# Patient Record
Sex: Female | Born: 1970 | Race: Black or African American | Hispanic: No | Smoking: Never smoker
Health system: Southern US, Community
[De-identification: ages and names within clinical notes are randomized; demographics above are authoritative.]

## PROBLEM LIST (undated history)

## (undated) DIAGNOSIS — M1990 Unspecified osteoarthritis, unspecified site: Secondary | ICD-10-CM

## (undated) DIAGNOSIS — F419 Anxiety disorder, unspecified: Secondary | ICD-10-CM

## (undated) DIAGNOSIS — K5909 Other constipation: Secondary | ICD-10-CM

## (undated) DIAGNOSIS — M069 Rheumatoid arthritis, unspecified: Secondary | ICD-10-CM

## (undated) DIAGNOSIS — E669 Obesity, unspecified: Secondary | ICD-10-CM

## (undated) DIAGNOSIS — E119 Type 2 diabetes mellitus without complications: Secondary | ICD-10-CM

## (undated) DIAGNOSIS — I10 Essential (primary) hypertension: Secondary | ICD-10-CM

## (undated) DIAGNOSIS — N946 Dysmenorrhea, unspecified: Secondary | ICD-10-CM

## (undated) DIAGNOSIS — N920 Excessive and frequent menstruation with regular cycle: Secondary | ICD-10-CM

## (undated) HISTORY — DX: Obesity, unspecified: E66.9

## (undated) HISTORY — DX: Essential (primary) hypertension: I10

## (undated) HISTORY — DX: Dysmenorrhea, unspecified: N94.6

## (undated) HISTORY — DX: Anxiety disorder, unspecified: F41.9

## (undated) HISTORY — DX: Type 2 diabetes mellitus without complications: E11.9

## (undated) HISTORY — DX: Rheumatoid arthritis, unspecified: M06.9

## (undated) HISTORY — DX: Excessive and frequent menstruation with regular cycle: N92.0

---

## 1994-08-10 DIAGNOSIS — K5909 Other constipation: Secondary | ICD-10-CM

## 1994-08-10 HISTORY — DX: Other constipation: K59.09

## 1994-08-10 HISTORY — PX: CHOLECYSTECTOMY: SHX55

## 2004-08-14 ENCOUNTER — Ambulatory Visit: Payer: Self-pay | Admitting: Internal Medicine

## 2004-09-27 ENCOUNTER — Ambulatory Visit: Payer: Self-pay | Admitting: Internal Medicine

## 2005-08-17 ENCOUNTER — Ambulatory Visit: Payer: Self-pay | Admitting: Obstetrics and Gynecology

## 2005-08-18 ENCOUNTER — Ambulatory Visit: Payer: Self-pay | Admitting: Obstetrics and Gynecology

## 2007-02-24 ENCOUNTER — Other Ambulatory Visit: Payer: Self-pay

## 2007-02-24 ENCOUNTER — Emergency Department: Payer: Self-pay | Admitting: Emergency Medicine

## 2008-06-25 ENCOUNTER — Ambulatory Visit: Payer: Self-pay | Admitting: Family

## 2008-08-13 ENCOUNTER — Ambulatory Visit: Payer: Self-pay | Admitting: Family

## 2008-08-21 ENCOUNTER — Ambulatory Visit: Payer: Self-pay | Admitting: Specialist

## 2009-07-22 ENCOUNTER — Other Ambulatory Visit: Payer: Self-pay | Admitting: Family

## 2010-07-22 ENCOUNTER — Ambulatory Visit: Payer: Self-pay | Admitting: Internal Medicine

## 2011-01-12 ENCOUNTER — Other Ambulatory Visit: Payer: Self-pay | Admitting: Internal Medicine

## 2011-07-24 ENCOUNTER — Ambulatory Visit (INDEPENDENT_AMBULATORY_CARE_PROVIDER_SITE_OTHER): Payer: PRIVATE HEALTH INSURANCE | Admitting: Internal Medicine

## 2011-07-24 ENCOUNTER — Encounter: Payer: Self-pay | Admitting: Internal Medicine

## 2011-07-24 DIAGNOSIS — E119 Type 2 diabetes mellitus without complications: Secondary | ICD-10-CM

## 2011-07-24 DIAGNOSIS — Z124 Encounter for screening for malignant neoplasm of cervix: Secondary | ICD-10-CM

## 2011-07-24 DIAGNOSIS — E282 Polycystic ovarian syndrome: Secondary | ICD-10-CM

## 2011-07-24 DIAGNOSIS — E669 Obesity, unspecified: Secondary | ICD-10-CM

## 2011-07-24 MED ORDER — NYSTATIN 100000 UNIT/GM EX POWD: CUTANEOUS | Status: AC

## 2011-07-24 MED ORDER — NORETHINDRONE 0.35 MG PO TABS: 1.0000 | ORAL_TABLET | Freq: Every day | ORAL | Status: DC

## 2011-07-24 NOTE — Assessment & Plan Note (Addendum)
She had a normal colposocopy in Jan 2011 which was done because of a prior positive HPV screen.  Will repeat PAP next year per patient preference.

## 2011-07-24 NOTE — Patient Instructions (Signed)
Your symptoms suggest that you have a syndrome called polycystic ovarian syndrome.  Weight loss will help prevent this from becoming a  serious health issues .  Use colace liquid, or Debrox to soften the ear wax in your right ear, and return for an irrigation in a few weeks if no improvement   Have your 2 hour post lunch blood sugar checked   Resume the low glyceminc index diet

## 2011-07-24 NOTE — Assessment & Plan Note (Signed)
sggested by recetn hgba1c of 6.3 by Sept las.  She has had elevated a1cs befoe which resonded to dieting and low glycemic enidex diet whic she has not been following.  Discussion today of lwo glyceminc indes,  Will repeat a1c in April

## 2011-07-26 ENCOUNTER — Encounter: Payer: Self-pay | Admitting: Internal Medicine

## 2011-07-26 DIAGNOSIS — E669 Obesity, unspecified: Secondary | ICD-10-CM | POA: Insufficient documentation

## 2011-07-26 DIAGNOSIS — E1159 Type 2 diabetes mellitus with other circulatory complications: Secondary | ICD-10-CM | POA: Insufficient documentation

## 2011-07-26 NOTE — Assessment & Plan Note (Addendum)
Diet controlled, with recent  hgba1c of 6.3.  The etiology may be post prandial elevatiosn she she has never had a diagnostic fasting glucose, so I have recommended that she check her 2 hour post prandials.  Spent 15 minutes of visit discussing her current diet, obesity and hirutism,  All suggestive of the diagnosis of PCOS. She does not have hypertriglyceridemia, but her LDL is 105.  recommended loss of 10% of her body weight using a structured diet systen, such as Medifast, Atkins , or Paleo diet  .  She will need repeat labs in 3 months.

## 2011-07-26 NOTE — Progress Notes (Signed)
  Subjective:    Patient ID: Mariah Brandt, female    DOB: 07/09/1971, 40 y.o.   MRN: [id number]  HPI  [name] is a 40 yr old AA female  with a history of diet controlled diabetes and obesity who is here to reestablish care.  She was last seen over one year ago for her annual exam. She had fasting labs done in September at  [facility] which she brings with her today.  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 She does not drink alcohol to excess, use tobacco or illicits,  And does not exercise regularly.  She has been able in the past to lose weight when prompted by abnormal labs which indicated diabetes, which has in the past lowered her hgba1c into the normal range.     Review of Systems  Constitutional: Positive for unexpected weight change. Negative for fever and chills.  HENT: Negative for hearing loss, ear pain, nosebleeds, congestion, sore throat, facial swelling, rhinorrhea, sneezing, mouth sores, trouble swallowing, neck pain, neck stiffness, voice change, postnasal drip, sinus pressure, tinnitus and ear discharge.   Eyes: Negative for pain, discharge, redness and visual disturbance.  Respiratory: Negative for cough, chest tightness, shortness of breath, wheezing and stridor.   Cardiovascular: Negative for chest pain, palpitations and leg swelling.  Genitourinary: Positive for menstrual problem.  Musculoskeletal: Negative for myalgias and arthralgias.  Skin: Negative for color change and rash.  Neurological: Negative for dizziness, weakness, light-headedness and headaches.  Hematological: Negative for adenopathy.       Objective:   Physical Exam  Constitutional: She is oriented to person, place, and time. She appears well-developed and well-nourished.       Hirsute, obese  HENT:  Mouth/Throat: Oropharynx is clear and moist.  Eyes: EOM are normal. Pupils are equal, round, and reactive to light. No scleral icterus.    Neck: Normal range of motion. Neck supple. No JVD present. No thyromegaly present.  Cardiovascular: Normal rate, regular rhythm, normal heart sounds and intact distal pulses.   Pulmonary/Chest: Effort normal and breath sounds normal.  Abdominal: Soft. Bowel sounds are normal. She exhibits no mass. There is no tenderness.  Musculoskeletal: Normal range of motion. She exhibits no edema.  Lymphadenopathy:    She has no cervical adenopathy.  Neurological: She is alert and oriented to person, place, and time.  Skin: Skin is warm and dry.  Psychiatric: She has a normal mood and affect.          Assessment & Plan:

## 2011-07-26 NOTE — Assessment & Plan Note (Signed)
Secondary to lack of exercise .  Discussed diet, . Dx of diabetes, PCOS and recommened 10% of body weight loss over the next several months.

## 2011-11-04 ENCOUNTER — Telehealth: Payer: Self-pay | Admitting: Internal Medicine

## 2011-11-04 NOTE — Telephone Encounter (Signed)
[phone] Pt would like order for her labs sent [facility] Please advise when order has been sent

## 2011-11-05 NOTE — Telephone Encounter (Signed)
Order has been sent

## 2011-11-05 NOTE — Telephone Encounter (Signed)
OK I have the [facility] order form to send

## 2011-11-05 NOTE — Telephone Encounter (Signed)
Patient has an appt at the end of April, she wanted to know if she needed any labs prior to her visit.  She will go to [facility] lab.  Please advise.

## 2011-11-09 ENCOUNTER — Ambulatory Visit: Payer: PRIVATE HEALTH INSURANCE | Admitting: Internal Medicine

## 2011-12-01 ENCOUNTER — Other Ambulatory Visit: Payer: Self-pay | Admitting: Internal Medicine

## 2011-12-01 LAB — COMPREHENSIVE METABOLIC PANEL
Albumin: 3.9 g/dL (ref 3.4–5.0)
BUN: 6 mg/dL — ABNORMAL LOW (ref 7–18)
Bilirubin,Total: 0.7 mg/dL (ref 0.2–1.0)
Calcium, Total: 8.4 mg/dL — ABNORMAL LOW (ref 8.5–10.1)
Chloride: 106 mmol/L (ref 98–107)
Creatinine: 0.68 mg/dL (ref 0.60–1.30)
EGFR (Non-African Amer.): >60
Glucose: 98 mg/dL (ref 65–99)
Potassium: 3.4 mmol/L — ABNORMAL LOW (ref 3.5–5.1)
SGPT (ALT): 20 U/L
Sodium: 141 mmol/L (ref 136–145)

## 2011-12-01 LAB — LIPID PANEL
Cholesterol: 170 mg/dL (ref 0–200)
HDL Cholesterol: 50 mg/dL (ref 40–60)
Ldl Cholesterol, Calc: 103 mg/dL — ABNORMAL HIGH (ref 0–100)
VLDL Cholesterol, Calc: 17 mg/dL (ref 5–40)

## 2011-12-07 ENCOUNTER — Encounter: Payer: Self-pay | Admitting: Internal Medicine

## 2011-12-07 ENCOUNTER — Ambulatory Visit (INDEPENDENT_AMBULATORY_CARE_PROVIDER_SITE_OTHER): Payer: PRIVATE HEALTH INSURANCE | Admitting: Internal Medicine

## 2011-12-07 VITALS — BP 110/72 | HR 90 | Temp 98.1°F | Resp 16 | Wt 174.8 lb

## 2011-12-07 DIAGNOSIS — F411 Generalized anxiety disorder: Secondary | ICD-10-CM

## 2011-12-07 DIAGNOSIS — E119 Type 2 diabetes mellitus without complications: Secondary | ICD-10-CM

## 2011-12-07 DIAGNOSIS — E669 Obesity, unspecified: Secondary | ICD-10-CM

## 2011-12-07 DIAGNOSIS — E876 Hypokalemia: Secondary | ICD-10-CM

## 2011-12-07 NOTE — Assessment & Plan Note (Signed)
Discussed resuming zoloft, for mood disorder.  She will consider this but is reluctant due to weight gain.

## 2011-12-07 NOTE — Assessment & Plan Note (Signed)
No change in hgba1c of 6.3,  No priro diagnostic fasting glucose.  Has not checked her sugars as requested.  Not following a low glycemic index diet yet and BMI remains > 30.  I have addressed  BMI and recommended a low glycemic index diet utilizing smaller more frequent meals to increase metabolism.  I have also recommended that patient start exercising with a goal of 30 minutes of aerobic exercise a minimum of 5 days per week. Screening for lipid disorders,was done 3 months ago and LDL was 105 normal trigs.

## 2011-12-07 NOTE — Progress Notes (Signed)
Patient ID: Mariah Brandt, female   DOB: 01-22-1971, 41 y.o.   MRN: [id number]  Patient Active Problem List  Diagnoses  . Screening for cervical cancer  . Obesity (BMI 30-39.9)  . Type II or unspecified type diabetes mellitus without mention of complication, not stated as uncontrolled  . Hypokalemia    Subjective:  CC:   Chief Complaint  Patient presents with  . Follow-up    HPI:   Mariah Frisby-Hendrixis a 41 y.o. female who presents  Follow up on labs done last week .  She describes recent onset of mood swings from angry to sad, no manic behavior , occurring both work and at home.  She has been having episodes of dizziness and intermittent, blurred vision. No diaphoresis, weakness, headaches or tremors.   Since the diagnosis of pre-iabetes by prior HgbA1c she has increased her water intake and has cut out sweet tea and switched to diet sodas, but does not know what to do with her diet. No eye exam in 2 years.   Past Medical History  Diagnosis Date  . Obesity (BMI 30-39.9)   . Diabetes mellitus 2010    diet controlled    Past Surgical History  Procedure Date  . Cholecystectomy 1996         The following portions of the patient's history were reviewed and updated as appropriate: Allergies, current medications, and problem list.    Review of Systems:   12 Pt  review of systems was negative except those addressed in the HPI,     History   Social History  . Marital Status: Married    Spouse Name: N/A    Number of Children: N/A  . Years of Education: N/A   Occupational History  . Not on file.   Social History Main Topics  . Smoking status: Never Smoker   . Smokeless tobacco: Never Used  . Alcohol Use: Yes     Pt drinks socially, maybe once a month  . Drug Use: No  . Sexually Active: Not on file   Other Topics Concern  . Not on file   Social History Narrative  . No narrative on file    Objective:  BP 110/72  Pulse 90  Temp(Src)  98.1 F (36.7 C) (Oral)  Resp 16  Wt 174 lb 12 oz (79.266 kg)  SpO2 98%  LMP 11/22/2011  General appearance: alert, cooperative and appears stated age Ears: normal TM's and external ear canals both ears Throat: lips, mucosa, and tongue normal; teeth and gums normal Neck: no adenopathy, no carotid bruit, supple, symmetrical, trachea midline and thyroid not enlarged, symmetric, no tenderness/mass/nodules Back: symmetric, no curvature. ROM normal. No CVA tenderness. Lungs: clear to auscultation bilaterally Heart: regular rate and rhythm, S1, S2 normal, no murmur, click, rub or gallop Abdomen: soft, non-tender; bowel sounds normal; no masses,  no organomegaly Pulses: 2+ and symmetric Skin: Skin color, texture, turgor normal. No rashes or lesions Lymph nodes: Cervical, supraclavicular, and axillary nodes normal.  Assessment and Plan: Type II or unspecified type diabetes mellitus without mention of complication, not stated as uncontrolled No change in hgba1c of 6.3,  No priro diagnostic fasting glucose.  Has not checked her sugars as requested.  Not following a low glycemic index diet yet and BMI remains > 30.  I have addressed  BMI and recommended a low glycemic index diet utilizing smaller more frequent meals to increase metabolism.  I have also recommended that patient start exercising with a goal of  30 minutes of aerobic exercise a minimum of 5 days per week. Screening for lipid disorders,was done 3 months ago and LDL was 105 normal trigs.     Obesity (BMI 30-39.9) I have addressed  BMI and recommended a low glycemic index diet utilizing smaller more frequent meals to increase metabolism.  I have also recommended that patient start exercising with a goal of 30 minutes of aerobic exercise a minimum of 5 days per week.    GAD (generalized anxiety disorder) Discussed resuming zoloft, for mood disorder.  She will consider this but is reluctant due to weight gain.     Updated Medication  List Outpatient Encounter Prescriptions as of 12/07/2011  Medication Sig Dispense Refill  . norethindrone (MICRONOR,CAMILA,ERRIN) 0.35 MG tablet Take 1 tablet (0.35 mg total) by mouth daily.  1 Package  11  . nystatin (MYCOSTATIN) powder Apply to affected area 2 times daily  15 g  1

## 2011-12-07 NOTE — Assessment & Plan Note (Signed)
I have addressed  BMI and recommended a low glycemic index diet utilizing smaller more frequent meals to increase metabolism.  I have also recommended that patient start exercising with a goal of 30 minutes of aerobic exercise a minimum of 5 days per week.  

## 2011-12-07 NOTE — Patient Instructions (Signed)
Consider the Low Glycemic Index Diet and 6 smaller meals daily .  This boosts your metabolism and regulates your sugars:   7 AM Low carbohydrate Protein  Shakes (EAS Carb Control  Or Atkins ,  Available everywhere,   In  cases at BJs )  2.5 carbs  (Add or substitute a toasted sandwhich thin w/ peanut butter)  10 AM: Protein bar by Atkins (snack size,  Chocolate lover's variety at  BJ's)    Lunch: sandwich on pita bread or flatbread (Joseph's makes a pita bread and a flat bread , available at [employer] and BJ's; Toufayah makes a low carb flatbread available at [employer] and [employer]) Mission makes a low carb whole wheat tortilla available at BJs,and most grocery stores   3 PM:  Mid day :  Another protein bar,  Or a  cheese stick, 1/4 cup of almonds, walnuts, pistachios, pecans, peanuts,  Macadamia nuts  6 PM  Dinner:  "mean and green:"  Meat/chicken/fish, salad, and green veggie : use ranch, vinagrette,  Blue cheese, etc  9 PM snack : Breyer's low carb fudgsicle or  ice cream bar (Carb Smart), or  Weight Watcher's ice cream bar , or another protein shake  

## 2012-02-08 ENCOUNTER — Encounter: Payer: Self-pay | Admitting: Internal Medicine

## 2012-07-22 ENCOUNTER — Other Ambulatory Visit: Payer: Self-pay

## 2012-07-22 MED ORDER — NORETHINDRONE 0.35 MG PO TABS: 1.0000 | ORAL_TABLET | Freq: Every day | ORAL | Status: DC

## 2012-07-22 NOTE — Telephone Encounter (Signed)
Refill request for Mariah Brandt 0.35 mg sent to [facility]

## 2012-09-21 LAB — HM DIABETES EYE EXAM: HM Diabetic Eye Exam: NORMAL

## 2012-12-21 LAB — HM DIABETES FOOT EXAM: HM DIABETIC FOOT EXAM: NORMAL

## 2013-06-21 ENCOUNTER — Encounter: Payer: Self-pay | Admitting: Internal Medicine

## 2013-06-21 ENCOUNTER — Ambulatory Visit (INDEPENDENT_AMBULATORY_CARE_PROVIDER_SITE_OTHER): Payer: 59 | Admitting: Internal Medicine

## 2013-06-21 VITALS — BP 124/82 | HR 76 | Temp 98.8°F | Resp 12 | Ht 61.0 in | Wt 171.8 lb

## 2013-06-21 DIAGNOSIS — F411 Generalized anxiety disorder: Secondary | ICD-10-CM

## 2013-06-21 DIAGNOSIS — E119 Type 2 diabetes mellitus without complications: Secondary | ICD-10-CM

## 2013-06-21 DIAGNOSIS — N8000 Endometriosis of the uterus, unspecified: Secondary | ICD-10-CM

## 2013-06-21 DIAGNOSIS — Z0000 Encounter for general adult medical examination without abnormal findings: Secondary | ICD-10-CM

## 2013-06-21 DIAGNOSIS — Z124 Encounter for screening for malignant neoplasm of cervix: Secondary | ICD-10-CM

## 2013-06-21 DIAGNOSIS — N8003 Adenomyosis of the uterus: Secondary | ICD-10-CM

## 2013-06-21 DIAGNOSIS — E669 Obesity, unspecified: Secondary | ICD-10-CM

## 2013-06-21 MED ORDER — CITALOPRAM HYDROBROMIDE 10 MG PO TABS: 10.0000 mg | ORAL_TABLET | Freq: Every day | ORAL | Status: DC

## 2013-06-21 MED ORDER — NORETHINDRONE 0.35 MG PO TABS: 1.0000 | ORAL_TABLET | Freq: Every day | ORAL | Status: DC

## 2013-06-21 NOTE — Progress Notes (Signed)
Pre-visit discussion using our clinic review tool. No additional management support is needed unless otherwise documented below in the visit note.  

## 2013-06-21 NOTE — Patient Instructions (Signed)
You had a PAP smear and  Physical today  Please get your fasting labs done ASAP at [facility].  It will take me a few days to input the data so you can access them via MyChart  If your A1c is still in the low 6.0s,  I will see you again in 6 months (sooner if higher)

## 2013-06-22 ENCOUNTER — Other Ambulatory Visit (HOSPITAL_COMMUNITY)
Admission: RE | Admit: 2013-06-22 | Discharge: 2013-06-22 | Disposition: A | Discharge status: Home / Self Care | Payer: 59 | Source: Ambulatory Visit | Attending: Internal Medicine | Admitting: Internal Medicine

## 2013-06-22 DIAGNOSIS — Z1151 Encounter for screening for human papillomavirus (HPV): Secondary | ICD-10-CM | POA: Insufficient documentation

## 2013-06-22 DIAGNOSIS — Z01419 Encounter for gynecological examination (general) (routine) without abnormal findings: Secondary | ICD-10-CM | POA: Insufficient documentation

## 2013-06-23 DIAGNOSIS — Z0000 Encounter for general adult medical examination without abnormal findings: Secondary | ICD-10-CM | POA: Insufficient documentation

## 2013-06-23 NOTE — Assessment & Plan Note (Addendum)
Resuming citalopram

## 2013-06-23 NOTE — Progress Notes (Signed)
Patient ID: Mariah Brandt, female   DOB: Oct 11, 1970, 42 y.o.   MRN: [id number]  Subjective:     Mariah Brandt is a 42 y.o. female and is here for a comprehensive physical exam. She has not been seen in over a year,  Has diet controlled DM.  The patient reports increased irritability at home due to stressors at work and is requesting to resume her SSRI.     History   Social History  . Marital Status: Married    Spouse Name: N/A    Number of Children: N/A  . Years of Education: N/A   Occupational History  . Not on file.   Social History Main Topics  . Smoking status: Never Smoker   . Smokeless tobacco: Never Used  . Alcohol Use: Yes     Comment: Pt drinks socially, maybe once a month  . Drug Use: No  . Sexual Activity: Not on file   Other Topics Concern  . Not on file   Social History Narrative  . No narrative on file   Health Maintenance  Topic Date Due  . Influenza Vaccine  03/10/2014  . Pap Smear  06/21/2016  . Tetanus/tdap  06/21/2021    The following portions of the patient's history were reviewed and updated as appropriate: allergies, current medications, past family history, past medical history, past social history, past surgical history and problem list.  Review of Systems A comprehensive review of systems was negative.   Objective:   General Appearance:    Alert, cooperative, no distress, appears stated age  Head:    Normocephalic, without obvious abnormality, atraumatic  Eyes:    PERRL, conjunctiva/corneas clear, EOM's intact, fundi    benign, both eyes  Ears:    Normal TM's and external ear canals, both ears  Nose:   Nares normal, septum midline, mucosa normal, no drainage    or sinus tenderness  Throat:   Lips, mucosa, and tongue normal; teeth and gums normal  Neck:   Supple, symmetrical, trachea midline, no adenopathy;    thyroid:  no enlargement/tenderness/nodules; no carotid   bruit or JVD  Back:     Symmetric, no curvature, ROM  normal, no CVA tenderness  Lungs:     Clear to auscultation bilaterally, respirations unlabored  Chest Wall:    No tenderness or deformity   Heart:    Regular rate and rhythm, S1 and S2 normal, no murmur, rub   or gallop  Breast Exam:    No tenderness, masses, or nipple abnormality  Abdomen:     Soft, non-tender, bowel sounds active all four quadrants,    no masses, no organomegaly  Genitalia:    Pelvic: cervix normal in appearance, external genitalia normal, no adnexal masses or tenderness, no cervical motion tenderness, rectovaginal septum normal, uterus normal size, shape, and consistency and vagina normal without discharge  Extremities:   Extremities normal, atraumatic, no cyanosis or edema  Pulses:   2+ and symmetric all extremities  Skin:   Skin color, texture, turgor normal, no rashes or lesions  Lymph nodes:   Cervical, supraclavicular, and axillary nodes normal  Neurologic:   CNII-XII intact, normal strength, sensation and reflexes    throughout    Assessment:   Type II or unspecified type diabetes mellitus without mention of complication, not stated as uncontrolled She is diet controlled but has not had labs in over one year.  Foot exam is normal.  Eye exam recommended.  Labs ordered.  Low GI diet given.  Obesity (BMI 30-39.9) I have addressed  BMI and recommended wt loss of 10% of body weigh over the next 6 months using a low glycemic index diet and regular exercise a minimum of 5 days per week.    GAD (generalized anxiety disorder) Resuming citalopram  Encounter for health maintenance examination Annual comprehensive exam was done including breast, pelvic and PAP smear. All screenings have been addressed .    Updated Medication List Outpatient Encounter Prescriptions as of 06/21/2013  Medication Sig  . citalopram (CELEXA) 10 MG tablet Take 1 tablet (10 mg total) by mouth daily.  . norethindrone (MICRONOR,CAMILA,ERRIN) 0.35 MG tablet Take 1 tablet (0.35 mg total) by  mouth daily.  . [DISCONTINUED] norethindrone (MICRONOR,CAMILA,ERRIN) 0.35 MG tablet Take 1 tablet (0.35 mg total) by mouth daily.

## 2013-06-23 NOTE — Assessment & Plan Note (Signed)
She is diet controlled but has not had labs in over one year.  Foot exam is normal.  Eye exam recommended.  Labs ordered.  Low GI diet given.

## 2013-06-23 NOTE — Assessment & Plan Note (Signed)
I have addressed  BMI and recommended wt loss of 10% of body weigh over the next 6 months using a low glycemic index diet and regular exercise a minimum of 5 days per week.   

## 2013-06-23 NOTE — Assessment & Plan Note (Signed)
Annual comprehensive exam was done including breast, pelvic and PAP smear. All screenings have been addressed .  

## 2013-06-25 LAB — HM PAP SMEAR: HM PAP: NORMAL

## 2013-06-26 ENCOUNTER — Encounter: Payer: Self-pay | Admitting: *Deleted

## 2013-06-26 ENCOUNTER — Other Ambulatory Visit: Payer: Self-pay | Admitting: Internal Medicine

## 2013-06-26 LAB — COMPREHENSIVE METABOLIC PANEL
Albumin: 3.8 g/dL (ref 3.4–5.0)
Alkaline Phosphatase: 118 U/L (ref 50–136)
Bilirubin,Total: 0.7 mg/dL (ref 0.2–1.0)
Glucose: 95 mg/dL (ref 65–99)
SGOT(AST): 12 U/L — ABNORMAL LOW (ref 15–37)
SGPT (ALT): 17 U/L (ref 12–78)
Sodium: 140 mmol/L (ref 136–145)

## 2013-06-26 LAB — CBC WITH DIFFERENTIAL/PLATELET
Eosinophil %: 1.4 %
HCT: 41.6 % (ref 35.0–47.0)
HGB: 13.9 g/dL (ref 12.0–16.0)
MCH: 30.1 pg (ref 26.0–34.0)
MCHC: 33.5 g/dL (ref 32.0–36.0)
MCV: 90 fL (ref 80–100)
Monocyte #: 0.8 x10 3/mm (ref 0.2–0.9)
Neutrophil #: 3.6 10*3/uL (ref 1.4–6.5)
RBC: 4.61 10*6/uL (ref 3.80–5.20)
RDW: 14.1 % (ref 11.5–14.5)
WBC: 6.4 10*3/uL (ref 3.6–11.0)

## 2013-06-26 LAB — LIPID PANEL
Cholesterol: 156 mg/dL (ref 0–200)
Ldl Cholesterol, Calc: 96 mg/dL (ref 0–100)
Triglycerides: 80 mg/dL (ref 0–200)

## 2013-07-03 ENCOUNTER — Telehealth: Payer: Self-pay | Admitting: Internal Medicine

## 2013-07-03 DIAGNOSIS — E876 Hypokalemia: Secondary | ICD-10-CM

## 2013-07-03 MED ORDER — POTASSIUM CHLORIDE ER 10 MEQ PO TBCR: 20.0000 meq | EXTENDED_RELEASE_TABLET | Freq: Every day | ORAL | Status: DC

## 2013-07-03 NOTE — Telephone Encounter (Signed)
Left message for patient to return call to office. 

## 2013-07-03 NOTE — Telephone Encounter (Signed)
Labs  Reviewed. All labs normal,. Including thyroid function and cholesterol , except potassium is slightly low at 3.3 .  I have rx'd  potassium chloride 10 meQ  2 tablets  daily # for 4 days qty #30 ,  Only take for 4 days,  Keep the rest on hand for future needs

## 2013-07-05 NOTE — Telephone Encounter (Signed)
Left message for patient to call office.  

## 2013-07-18 ENCOUNTER — Encounter: Payer: Self-pay | Admitting: Internal Medicine

## 2013-07-19 NOTE — Telephone Encounter (Signed)
Patient notifed

## 2013-07-24 ENCOUNTER — Encounter: Payer: Self-pay | Admitting: Internal Medicine

## 2013-07-25 LAB — HEPATIC FUNCTION PANEL
ALT: 17 U/L (ref 7–35)
AST: 12 U/L — AB (ref 13–35)
Alkaline Phosphatase: 118 U/L (ref 25–125)
Bilirubin, Total: 0.7 mg/dL

## 2013-07-25 LAB — LIPID PANEL
CHOLESTEROL: 156 mg/dL (ref 0–200)
HDL: 44 mg/dL (ref 35–70)
LDL Cholesterol: 96 mg/dL
Triglycerides: 80 mg/dL (ref 40–160)

## 2013-07-25 LAB — CBC AND DIFFERENTIAL
Hemoglobin: 13.9 g/dL (ref 12.0–16.0)
Platelets: 252 10*3/uL (ref 150–399)
WBC: 6.4 10*3/mL

## 2013-07-25 LAB — BASIC METABOLIC PANEL
BUN: 26 mg/dL — AB (ref 4–21)
CREATININE: 0.7 mg/dL (ref 0.5–1.1)
Glucose: 95 mg/dL
POTASSIUM: 3.3 mmol/L — AB (ref 3.4–5.3)
Sodium: 140 mmol/L (ref 137–147)

## 2013-12-20 ENCOUNTER — Encounter: Payer: Self-pay | Admitting: Internal Medicine

## 2013-12-20 ENCOUNTER — Ambulatory Visit (INDEPENDENT_AMBULATORY_CARE_PROVIDER_SITE_OTHER): Payer: 59 | Admitting: Internal Medicine

## 2013-12-20 VITALS — BP 126/98 | HR 84 | Temp 99.1°F | Resp 16 | Wt 182.5 lb

## 2013-12-20 DIAGNOSIS — E119 Type 2 diabetes mellitus without complications: Secondary | ICD-10-CM

## 2013-12-20 DIAGNOSIS — F411 Generalized anxiety disorder: Secondary | ICD-10-CM

## 2013-12-20 DIAGNOSIS — E669 Obesity, unspecified: Secondary | ICD-10-CM

## 2013-12-20 LAB — HM DIABETES FOOT EXAM: HM DIABETIC FOOT EXAM: NORMAL

## 2013-12-20 NOTE — Patient Instructions (Signed)
I want you to get to 158 lbs  By the end of this year.  This is  One version of a  "Low GI"  Diet:  It will still lower your blood sugars and allow you to lose 4 to 8  lbs  per month if you follow it carefully.  Your goal with exercise is a minimum of 30 minutes of aerobic exercise 5 days per week (Walking does not count once it becomes easy!)    All of the foods can be found at grocery stores and in bulk at [employer].  The Atkins protein bars and shakes are available in more varieties at Target, WalMart and [place].     7 AM Breakfast:  Choose from the following:  Low carbohydrate Protein  Shakes (I recommend the EAS AdvantEdge "Carb Control" shakes  Or the low carb shakes by Atkins.    2.5 carbs   Arnold's "Sandwhich Thin"toasted  w/ peanut butter (no jelly: about 20 net carbs  "Bagel Thin" with cream cheese and salmon: about 20 carbs   a scrambled egg/bacon/cheese burrito made with Mission's "carb balance" whole wheat tortilla  (about 10 net carbs )   Avoid cereal and bananas, oatmeal and cream of wheat and grits. They are loaded with carbohydrates!   10 AM: high protein snack  Protein bar by Atkins (the snack size, under 200 cal, usually < 6 net carbs).    A stick of cheese:  Around 1 carb,  100 cal     Dannon Light n Fit [place] Yogurt  (80 cal, 8 carbs)  Other so called "protein bars" and Greek yogurts tend to be loaded with carbohydrates.  Remember, in food advertising, the word "energy" is synonymous for " carbohydrate."  Lunch:   A Sandwich using the bread choices listed, Can use any  Eggs,  lunchmeat, grilled meat or canned tuna), avocado, regular mayo/mustard  and cheese.  A Salad using blue cheese, ranch,  Goddess or vinagrette,  No croutons or "confetti" and no "candied nuts" but regular nuts OK.   No pretzels or chips.  Pickles and miniature sweet peppers are a good low carb alternative that provide a "crunch"  The bread is the only source of carbohydrate in a sandwich and   can be decreased by trying some of these alternatives to traditional loaf bread  Joseph's makes a pita bread and a flat bread that are 50 cal and 4 net carbs available at [place] and [place].  This can be toasted to use with hummous as well  Toufayan makes a low carb flatbread that's 100 cal and 9 net carbs available at [employer] and BJ's makes 2 sizes of  Low carb whole wheat tortilla  (The large one is 210 cal and 6 net carbs) Avoid "Low fat dressings, as well as Barry Brunner and [place] dressings They are loaded with sugar!   3 PM/ Mid day  Snack:  Consider  1 ounce of  almonds, walnuts, pistachios, pecans, peanuts,  Macadamia nuts or a nut medley.  Avoid "granola"; the dried cranberries and raisins are loaded with carbohydrates. Mixed nuts as long as there are no raisins,  cranberries or dried fruit.    Try the prosciutto/mozzarella cheese sticks by [employer]  In deli /backery section   High protein      6 PM  Dinner:     Meat/fowl/fish with a green salad, and either broccoli, cauliflower, green beans, spinach, brussel sprouts or  [place]  beans. DO NOT BREAD THE PROTEIN!!      There is a low carb pasta by Dreamfield's that is acceptable and tastes great: only 5 digestible carbs/serving.( All grocery stores but BJs carry it )  Try Hurley Cisco Angelo's chicken piccata or chicken or eggplant parm over low carb pasta.(Lowes and BJs)   Marjory Lies Sanchez's "Carnitas" (pulled pork, no sauce,  0 carbs) or his beef pot roast to make a dinner burrito (at BJ's)  Pesto over low carb pasta (bj's sells a good quality pesto in the center refrigerated section of the deli   Try satueeing  Cheral Marker with mushroooms  Whole wheat pasta is still full of digestible carbs and  Not as low in glycemic index as Dreamfield's.   Brown rice is still rice,  So skip the rice and noodles if you eat [place] or [place] (or at least limit to 1/2 cup)  9 PM snack :   Breyer's "low carb" fudgsicle or  ice cream bar (Carb Smart  line), or  Weight Watcher's ice cream bar , or another "no sugar added" ice cream;  a serving of fresh berries/cherries with whipped cream   Cheese or DANNON'S LlGHT N FIT GREEK YOGURT  8 ounces of [place] unsweetened almond/cococunut milk    Avoid bananas, pineapple, grapes  and watermelon on a regular basis because they are high in sugar.  THINK OF THEM AS DESSERT  Remember that snack Substitutions should be less than 10 NET carbs per serving and meals < 20 carbs. Remember to subtract fiber grams to get the "net carbs."

## 2013-12-20 NOTE — Progress Notes (Signed)
Pre-visit discussion using our clinic review tool. No additional management support is needed unless otherwise documented below in the visit note.  

## 2013-12-20 NOTE — Progress Notes (Signed)
Patient ID: Mariah Brandt, female   DOB: February 23, 1971, 43 y.o.   MRN: [id number]  Patient Active Problem List   Diagnosis Date Noted  . Encounter for health maintenance examination 06/23/2013  . Adenomyosis 06/21/2013  . GAD (generalized anxiety disorder) 12/07/2011  . Obesity (BMI 30-39.9)   . Type II or unspecified type diabetes mellitus without mention of complication, not stated as uncontrolled   . Screening for cervical cancer 07/24/2011    Subjective:  CC:   Chief Complaint  Patient presents with  . Follow-up  . Diabetes    HPI:   Mariah Brandt is a 43 y.o. female who presents for Follow up on type 2 DM, GAD and obesity. She has been following a low glycemic index intermittently.   Checks her blood sugars once or twice per week.   Breakfast: bacon egg chees on wheat bread  or sourdough,  Or oatmeal  Lunch:  Pizza or salad   Cheeseburger/fries. Dinner is a grilled or baked meat.  Potato or rice, and a green vegetable.    Has gained 11 lbs since November .  Has not noticed the weight gain. Not exercising regularly,  When she does, it's a walk at lunchtime.    Past Medical History  Diagnosis Date  . Obesity (BMI 30-39.9)   . Diabetes mellitus 2010    diet controlled    Past Surgical History  Procedure Laterality Date  . Cholecystectomy  1996       The following portions of the patient's history were reviewed and updated as appropriate: Allergies, current medications, and problem list.    Review of Systems:   Patient denies headache, fevers, malaise, unintentional weight loss, skin rash, eye pain, sinus congestion and sinus pain, sore throat, dysphagia,  hemoptysis , cough, dyspnea, wheezing, chest pain, palpitations, orthopnea, edema, abdominal pain, nausea, melena, diarrhea, constipation, flank pain, dysuria, hematuria, urinary  Frequency, nocturia, numbness, tingling, seizures,  Focal weakness, Loss of consciousness,  Tremor, insomnia,  depression, anxiety, and suicidal ideation.     History   Social History  . Marital Status: Married    Spouse Name: N/A    Number of Children: N/A  . Years of Education: N/A   Occupational History  . Not on file.   Social History Main Topics  . Smoking status: Never Smoker   . Smokeless tobacco: Never Used  . Alcohol Use: Yes     Comment: Pt drinks socially, maybe once a month  . Drug Use: No  . Sexual Activity: Not on file   Other Topics Concern  . Not on file   Social History Narrative  . No narrative on file    Objective:  Filed Vitals:   12/20/13 1554  BP: 126/98  Pulse: 84  Temp: 99.1 F (37.3 C)  Resp: 16     General appearance: alert, cooperative and appears stated age Ears: normal TM's and external ear canals both ears Throat: lips, mucosa, and tongue normal; teeth and gums normal Neck: no adenopathy, no carotid bruit, supple, symmetrical, trachea midline and thyroid not enlarged, symmetric, no tenderness/mass/nodules Back: symmetric, no curvature. ROM normal. No CVA tenderness. Lungs: clear to auscultation bilaterally Heart: regular rate and rhythm, S1, S2 normal, no murmur, click, rub or gallop Abdomen: soft, non-tender; bowel sounds normal; no masses,  no organomegaly Pulses: 2+ and symmetric Skin: Skin color, texture, turgor normal. No rashes or lesions Lymph nodes: Cervical, supraclavicular, and axillary nodes normal.  Assessment and Plan:  Type II or unspecified type  diabetes mellitus without mention of complication, not stated as uncontrolled Well-controlled on diet alone .  hemoglobin A1c has been consistently less than 7.0 . Patient is up-to-date on eye exams and foot exam was done today.  There is  no proteinuria on today's micro urinalysis .  Fasting lipids have been reviewed and statin therapy has been advised and updated according to new ACC guidelines based on patient's 10 year risk of CAD annual eye exam recommended.    Low GI diet  recommended for weight loss.     Lab Results  Component Value Date   HGBA1C 5.9 12/20/2013   Lab Results  Component Value Date   MICROALBUR 1.8 12/20/2013     GAD (generalized anxiety disorder) Controlled with  citalopram    Obesity (BMI 30-39.9) I have addressed  BMI and recommended wt loss of 10% of body weigh over the next 6 months using a low glycemic index diet and regular exercise a minimum of 5 days per week.     Updated Medication List Outpatient Encounter Prescriptions as of 12/20/2013  Medication Sig  . citalopram (CELEXA) 10 MG tablet Take 1 tablet (10 mg total) by mouth daily.  . norethindrone (MICRONOR,CAMILA,ERRIN) 0.35 MG tablet Take 1 tablet (0.35 mg total) by mouth daily.  . [DISCONTINUED] potassium chloride (K-DUR) 10 MEQ tablet Take 2 tablets (20 mEq total) by mouth daily.     Orders Placed This Encounter  Procedures  . Hemoglobin A1c  . Microalbumin / creatinine urine ratio  . CBC and differential  . Basic metabolic panel  . Hepatic function panel  . Lipid panel  . HM PAP SMEAR  . HM DIABETES FOOT EXAM  . HM DIABETES FOOT EXAM    No Follow-up on file.

## 2013-12-21 LAB — HEMOGLOBIN A1C: Hgb A1c MFr Bld: 5.9 % (ref 4.6–6.5)

## 2013-12-21 LAB — MICROALBUMIN / CREATININE URINE RATIO
Creatinine,U: 250.9 mg/dL
MICROALB UR: 1.8 mg/dL (ref 0.0–1.9)
Microalb Creat Ratio: 0.7 mg/g (ref 0.0–30.0)

## 2013-12-22 ENCOUNTER — Encounter: Payer: Self-pay | Admitting: Internal Medicine

## 2013-12-23 ENCOUNTER — Encounter: Payer: Self-pay | Admitting: Internal Medicine

## 2013-12-23 NOTE — Assessment & Plan Note (Signed)
I have addressed  BMI and recommended wt loss of 10% of body weigh over the next 6 months using a low glycemic index diet and regular exercise a minimum of 5 days per week.   

## 2013-12-23 NOTE — Assessment & Plan Note (Signed)
Controlled with  citalopram

## 2013-12-23 NOTE — Assessment & Plan Note (Addendum)
Well-controlled on diet alone .  hemoglobin A1c has been consistently less than 7.0 . Patient is up-to-date on eye exams and foot exam was done today.  There is  no proteinuria on today's micro urinalysis .  Fasting lipids have been reviewed and statin therapy has been advised and updated according to new ACC guidelines based on patient's 10 year risk of CAD annual eye exam recommended.    Low GI diet recommended for weight loss.     Lab Results  Component Value Date   HGBA1C 5.9 12/20/2013   Lab Results  Component Value Date   MICROALBUR 1.8 12/20/2013

## 2014-03-27 ENCOUNTER — Encounter: Payer: Self-pay | Admitting: *Deleted

## 2014-03-29 NOTE — Telephone Encounter (Signed)
Mailed unread message to pt  

## 2014-04-10 ENCOUNTER — Ambulatory Visit (INDEPENDENT_AMBULATORY_CARE_PROVIDER_SITE_OTHER): Payer: 59 | Admitting: *Deleted

## 2014-04-10 VITALS — BP 104/68 | HR 87 | Temp 99.1°F | Resp 16

## 2014-04-10 DIAGNOSIS — IMO0001 Reserved for inherently not codable concepts without codable children: Secondary | ICD-10-CM

## 2014-04-10 DIAGNOSIS — R030 Elevated blood-pressure reading, without diagnosis of hypertension: Secondary | ICD-10-CM

## 2014-04-10 LAB — MEASURE BLOOD PRESSURE

## 2014-04-12 ENCOUNTER — Encounter: Payer: Self-pay | Admitting: Internal Medicine

## 2014-06-27 ENCOUNTER — Encounter: Payer: Self-pay | Admitting: Internal Medicine

## 2014-06-27 ENCOUNTER — Ambulatory Visit (INDEPENDENT_AMBULATORY_CARE_PROVIDER_SITE_OTHER): Payer: 59 | Admitting: Internal Medicine

## 2014-06-27 VITALS — BP 144/92 | HR 95 | Temp 98.5°F | Resp 15 | Ht 61.0 in | Wt 182.5 lb

## 2014-06-27 DIAGNOSIS — E119 Type 2 diabetes mellitus without complications: Secondary | ICD-10-CM

## 2014-06-27 DIAGNOSIS — E669 Obesity, unspecified: Secondary | ICD-10-CM

## 2014-06-27 DIAGNOSIS — K5909 Other constipation: Secondary | ICD-10-CM

## 2014-06-27 DIAGNOSIS — Z0000 Encounter for general adult medical examination without abnormal findings: Secondary | ICD-10-CM

## 2014-06-27 MED ORDER — LACTULOSE 20 GM/30ML PO SOLN: 30.0000 mL | Freq: Four times a day (QID) | ORAL | Status: DC | PRN

## 2014-06-27 MED ORDER — POLYETHYLENE GLYCOL 3350 17 GM/SCOOP PO POWD: 17.0000 g | Freq: Every day | ORAL | Status: DC

## 2014-06-27 NOTE — Patient Instructions (Signed)
You had your annual  wellness exam today.  We will repeat your PAP smear in 2016, sooner if needed   We will schedule your mammogram soon   Please get fasting labs at your earliest convenience.   We will contact you with the bloodwork results  You need for more fiber in your diet .  Try to get 25 grams daily using nuts,  Low carb flatbreads,  And fruit with edible skins.  And drink 3 16 ounce servigs of water daily  You can use the lactulose once a week,  But use the miralax daily at bedtime  I want you to lose 18 lbs over the next six months with a low glycemic index diet and regular exercise (30 minutes of cardio 5 days per week is your goal)  This is  my version of a  "Low GI"  Diet:  It will still lower your blood sugars and allow you to lose 4 to 8  lbs  per month if you follow it carefully.  Your goal with exercise is a minimum of 30 minutes of aerobic exercise 5 days per week (Walking does not count once it becomes easy!)     All of the foods can be found at grocery stores and in bulk at [employer].  The Atkins protein bars and shakes are available in more varieties at [employer], [employer] and [place].     7 AM Breakfast:  Choose from the following:  Low carbohydrate Protein  Shakes (I recommend the EAS AdvantEdge "Carb Control" shakes  Or the low carb shakes by Atkins.    2.5 carbs   Arnold's "Sandwhich Thin"toasted  w/ peanut butter (no jelly: about 20 net carbs  "Bagel Thin" with cream cheese and salmon: about 20 carbs   a scrambled egg/bacon/cheese burrito made with Mission's "carb balance" whole wheat tortilla  (about 10 net carbs )  A slice of home made fritatta (egg based dish without a crust:  google it)    Avoid cereal and bananas, oatmeal and cream of wheat and grits. They are loaded with carbohydrates!   10 AM: high protein snack  Protein bar by Atkins (the snack size, under 200 cal, usually < 6 net carbs).    A stick of cheese:  Around 1 carb,  100 cal     Dannon  Light n Fit [place] Yogurt  (80 cal, 8 carbs)  Other so called "protein bars" and Greek yogurts tend to be loaded with carbohydrates.  Remember, in food advertising, the word "energy" is synonymous for " carbohydrate."  Lunch:   A Sandwich using the bread choices listed, Can use any  Eggs,  lunchmeat, grilled meat or canned tuna), avocado, regular mayo/mustard  and cheese.  A Salad using blue cheese, ranch,  Goddess or vinagrette,  No croutons or "confetti" and no "candied nuts" but regular nuts OK.   No pretzels or chips.  Pickles and miniature sweet peppers are a good low carb alternative that provide a "crunch"  The bread is the only source of carbohydrate in a sandwich and  can be decreased by trying some of these alternatives to traditional loaf bread  Joseph's makes a pita bread and a flat bread that are 50 cal and 4 net carbs available at [place] and [place].  This can be toasted to use with hummous as well  Toufayan makes a low carb flatbread that's 100 cal and 9 net carbs available at [employer] and [employer]  makes 2 sizes of  Low carb whole wheat tortilla  (The large one is 210 cal and 6 net carbs)  Flat Out makes flatbreads that are low carb as well  Avoid "Low fat dressings, as well as Barry Brunner and [place] dressings They are loaded with sugar!   3 PM/ Mid day  Snack:  Consider  1 ounce of  almonds, walnuts, pistachios, pecans, peanuts,  Macadamia nuts or a nut medley.  Avoid "granola"; the dried cranberries and raisins are loaded with carbohydrates. Mixed nuts as long as there are no raisins,  cranberries or dried fruit.    Try the prosciutto/mozzarella cheese sticks by Fiorruci  In deli /backery section   High protein   To avoid overindulging in snacks: Try drinking a glass of unsweeted almond/coconut milk  Or a cup of coffee with your Atkins chocolate bar to keep you from having 3!!!   Pork rinds!  Yes Pork Rinds        6 PM  Dinner:     Meat/fowl/fish with a green salad,  and either broccoli, cauliflower, green beans, spinach, brussel sprouts or  Lima beans. DO NOT BREAD THE PROTEIN!!      There is a low carb pasta by Dreamfield's that is acceptable and tastes great: only 5 digestible carbs/serving.( All grocery stores but BJs carry it )  Try Hurley Cisco Angelo's chicken piccata or chicken or eggplant parm over low carb pasta.(Lowes and BJs)   Marjory Lies Sanchez's "Carnitas" (pulled pork, no sauce,  0 carbs) or his beef pot roast to make a dinner burrito (at [employer]J's)  Pesto over low carb pasta (bj's sells a good quality pesto in the center refrigerated section of the deli   Try satueeing  Cheral Marker with mushroooms  Whole wheat pasta is still full of digestible carbs and  Not as low in glycemic index as Dreamfield's.   Brown rice is still rice,  So skip the rice and noodles if you eat [place] or [place] (or at least limit to 1/2 cup)  9 PM snack :   Breyer's "low carb" fudgsicle or  ice cream bar (Carb Smart line), or  Weight Watcher's ice cream bar , or another "no sugar added" ice cream;  a serving of fresh berries/cherries with whipped cream   Cheese or DANNON'S LlGHT N FIT GREEK YOGURT or the [place] greek yogurt   8 ounces of [place] unsweetened almond/cococunut milk  Cheese and crackers (using WASA crackers,  They are low carb) or peanut butter on low carb crackers or pita bread     Avoid bananas, pineapple, grapes  and watermelon on a regular basis because they are high in sugar.  THINK OF THEM AS DESSERT  Remember that snack Substitutions should be less than 10 NET carbs per serving and meals should be < 25 net carbs. Remember that carbohydrates from fiber do not affect blood sugar, so you can  subtract fiber grams to get the "net carbs " of any particular food item.

## 2014-06-27 NOTE — Progress Notes (Signed)
Patient ID: Mariah Brandt, female   DOB: 11/13/1970, 43 y.o.   MRN: [id number]  Subjective:     Mariah Brandt is a 43 y.o. female and is here for a comprehensive physical exam. The patient reports problems - with weight gain and constipation.   Weight gain discussed .  She has been unable to find time for exercise due to her work schedule.  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 Laxative use reviewed.    History   Social History  . Marital Status: Married    Spouse Name: N/A    Number of Children: N/A  . Years of Education: N/A   Occupational History  . Not on file.   Social History Main Topics  . Smoking status: Never Smoker   . Smokeless tobacco: Never Used  . Alcohol Use: Yes     Comment: Pt drinks socially, maybe once a month  . Drug Use: No  . Sexual Activity: Not on file   Other Topics Concern  . Not on file   Social History Narrative   Health Maintenance  Topic Date Due  . PNEUMOCOCCAL POLYSACCHARIDE VACCINE (1) 01/06/1975  . OPHTHALMOLOGY EXAM  09/21/2013  . HEMOGLOBIN A1C  06/22/2014  . FOOT EXAM  12/21/2014  . URINE MICROALBUMIN  12/21/2014  . INFLUENZA VACCINE  03/11/2015  . PAP SMEAR  06/25/2016  . TETANUS/TDAP  06/21/2021    The following portions of the patient's history were reviewed and updated as appropriate: allergies, current medications, past family history, past medical history, past social history, past surgical history and problem list.  Review of Systems A comprehensive review of systems was negative.   Objective:   BP 144/92 mmHg  Pulse 95  Temp(Src) 98.5 F (36.9 C) (Oral)  Resp 15  Ht 5' 1"  (1.549 m)  Wt 182 lb 8 oz (82.781 kg)  BMI 34.50 kg/m2  SpO2 98%  LMP 06/10/2014 (Approximate)  General appearance: alert, cooperative and appears stated age Head: Normocephalic, without obvious  abnormality, atraumatic Eyes: conjunctivae/corneas clear. PERRL, EOM's intact. Fundi benign. Ears: normal TM's and external ear canals both ears Nose: Nares normal. Septum midline. Mucosa normal. No drainage or sinus tenderness. Throat: lips, mucosa, and tongue normal; teeth and gums normal Neck: no adenopathy, no carotid bruit, no JVD, supple, symmetrical, trachea midline and thyroid not enlarged, symmetric, no tenderness/mass/nodules Lungs: clear to auscultation bilaterally Breasts: normal appearance, no masses or tenderness Heart: regular rate and rhythm, S1, S2 normal, no murmur, click, rub or gallop Abdomen: soft, non-tender; bowel sounds normal; no masses,  no organomegaly Extremities: extremities normal, atraumatic, no cyanosis or edema Pulses: 2+ and symmetric Skin: Skin color, texture, turgor normal. No rashes or lesions Neurologic: Alert and oriented X 3, normal strength and tone. Normal symmetric reflexes. Normal coordination and gait.   .    Assessment and Plan:   Constipation Chronic, episodic ,  may be IBS. The first course is to increase the fiber in her diet.  she can take miralax, metamucil, fibercon, or citrucel daily to supplement her fiber. These are gentle and work in 1 to 2 days.  If she been without a BM for over 3 days , she may need to use a cathartic laxative like Milk of Magnesia or lactulose , then start the daily fiber supplement and frink at least 3 bottles of noncaffeinated beverage daily   Encounter  for health maintenance examination Annual wellness  exam was done as well as a comprehensive physical exam and management of acute and chronic conditions .  During the course of the visit the patient was educated and counseled about appropriate screening and preventive services including :  diabetes screening, lipid analysis with projected  10 year  risk for CAD , nutrition counseling, colorectal cancer screening, and recommended immunizations.  Printed  recommendations for health maintenance screenings was given.   Obesity (BMI 30-39.9) I have addressed  BMI and recommended wt loss of 10% of body weigh over the next 6 months using a low glycemic index diet and regular exercise a minimum of 5 days per week.    Diabetes mellitus without complication Well-controlled on diet alone .  hemoglobin A1c has been consistently less than 6.0 . Patient is up-to-date on eye exams and foot exam was done today.  There is  no proteinuria on prior micro urinalysis .  Fasting lipids have been reviewed and statin therapy has been advised and updated according to new ACC guidelines based on patient's 10 year risk of CAD annual eye exam recommended.    Low GI diet recommended for weight loss.     Lab Results  Component Value Date   HGBA1C 5.9 12/20/2013   Lab Results  Component Value Date   MICROALBUR 1.8 12/20/2013        Updated Medication List Outpatient Encounter Prescriptions as of 06/27/2014  Medication Sig  . citalopram (CELEXA) 10 MG tablet Take 1 tablet (10 mg total) by mouth daily.  . norethindrone (MICRONOR,CAMILA,ERRIN) 0.35 MG tablet Take 1 tablet (0.35 mg total) by mouth daily.  . Lactulose 20 GM/30ML SOLN Take 30 mLs (20 g total) by mouth every 6 (six) hours as needed. Until constipation is relieved  . polyethylene glycol powder (GLYCOLAX/MIRALAX) powder Take 17 g by mouth daily.

## 2014-06-27 NOTE — Progress Notes (Signed)
Pre-visit discussion using our clinic review tool. No additional management support is needed unless otherwise documented below in the visit note.  

## 2014-06-30 ENCOUNTER — Encounter: Payer: Self-pay | Admitting: Internal Medicine

## 2014-06-30 DIAGNOSIS — K5900 Constipation, unspecified: Secondary | ICD-10-CM | POA: Insufficient documentation

## 2014-06-30 NOTE — Assessment & Plan Note (Signed)
Well-controlled on diet alone .  hemoglobin A1c has been consistently less than 6.0 . Patient is up-to-date on eye exams and foot exam was done today.  There is  no proteinuria on prior micro urinalysis .  Fasting lipids have been reviewed and statin therapy has been advised and updated according to new ACC guidelines based on patient's 10 year risk of CAD annual eye exam recommended.    Low GI diet recommended for weight loss.     Lab Results  Component Value Date   HGBA1C 5.9 12/20/2013   Lab Results  Component Value Date   MICROALBUR 1.8 12/20/2013

## 2014-06-30 NOTE — Assessment & Plan Note (Signed)
Chronic, episodic ,  may be IBS. The first course is to increase the fiber in her diet.  she can take miralax, metamucil, fibercon, or citrucel daily to supplement her fiber. These are gentle and work in 1 to 2 days.  If she been without a BM for over 3 days , she may need to use a cathartic laxative like Milk of Magnesia or lactulose , then start the daily fiber supplement and frink at least 3 bottles of noncaffeinated beverage daily

## 2014-06-30 NOTE — Assessment & Plan Note (Signed)
I have addressed  BMI and recommended wt loss of 10% of body weigh over the next 6 months using a low glycemic index diet and regular exercise a minimum of 5 days per week.   

## 2014-06-30 NOTE — Assessment & Plan Note (Signed)
Annual wellness  exam was done as well as a comprehensive physical exam and management of acute and chronic conditions .  During the course of the visit the patient was educated and counseled about appropriate screening and preventive services including :  diabetes screening, lipid analysis with projected  10 year  risk for CAD , nutrition counseling, colorectal cancer screening, and recommended immunizations.  Printed recommendations for health maintenance screenings was given.

## 2014-07-17 ENCOUNTER — Encounter: Payer: Self-pay | Admitting: Internal Medicine

## 2014-07-17 NOTE — Telephone Encounter (Signed)
Please advise I will fill out lab sheet for [facility].

## 2014-07-19 NOTE — Telephone Encounter (Signed)
Letter printed.

## 2014-08-07 NOTE — Telephone Encounter (Signed)
New order faxed.

## 2014-08-27 ENCOUNTER — Other Ambulatory Visit: Payer: Self-pay | Admitting: Internal Medicine

## 2015-01-16 ENCOUNTER — Encounter: Payer: Self-pay | Admitting: Obstetrics and Gynecology

## 2015-02-19 ENCOUNTER — Ambulatory Visit (INDEPENDENT_AMBULATORY_CARE_PROVIDER_SITE_OTHER): Payer: 59 | Admitting: Obstetrics and Gynecology

## 2015-02-19 ENCOUNTER — Encounter: Payer: Self-pay | Admitting: Obstetrics and Gynecology

## 2015-02-19 VITALS — BP 145/85 | HR 83 | Ht 61.0 in | Wt 182.6 lb

## 2015-02-19 DIAGNOSIS — N921 Excessive and frequent menstruation with irregular cycle: Secondary | ICD-10-CM | POA: Diagnosis not present

## 2015-02-19 DIAGNOSIS — Z124 Encounter for screening for malignant neoplasm of cervix: Secondary | ICD-10-CM | POA: Diagnosis not present

## 2015-02-19 DIAGNOSIS — E669 Obesity, unspecified: Secondary | ICD-10-CM

## 2015-02-19 NOTE — Progress Notes (Signed)
Subjective:     Mariah Brandt is an 44 y.o. G1P1001 woman who presents for irregular menses. She had been bleeding monthly, however noted heavy menses since menarche. She reports that over the past 2 years menses has become even heavier, and now she has not had a cycle since April.  Has taken several home UPTs which have all been negative.  She changes her pad or tampon every 2 hours.  Menses lasts for 5-6 days.  Is passing small clots. Dysmenorrhea:mild-moderate, occuring during the cycle.  Current contraception: oral progesterone-only contraceptive (taking continuously, prescribed by PCP). Has used Depo Provera, OCPs, in the past without relief of symptoms. Has also used Mirena IUD in the past, however notes expulsion after 3 years of use.  History of abnormal Pap smear: no.  Reports last ultrasound in 2010 where she thinks she was diagnosed with adenomyosis.   Menstrual History: Obstetric History   G1   P1   T1   P0   A0   TAB0   SAB0   E0   M0   L1     # Outcome Date GA Lbr Len/2nd Weight Sex Delivery Anes PTL Lv  1 Term 1995 [redacted]w[redacted]d 6 lb 15 oz (3.147 kg) M Vag-Spont   Y      Menarche age: [age]Patient's last menstrual period was 11/28/2014.  Denies h/o abnormal paps.  Last pap smear 2012.      Past Medical History  Diagnosis Date  . Obesity (BMI 30-39.9)   . Diabetes mellitus 2010    diet controlled  . Anxiety   . Dysmenorrhea   . Menorrhagia      Past Surgical History  Procedure Laterality Date  . Cholecystectomy  1996    Family History  Problem Relation Age of Onset  . Hyperlipidemia Mother   . Fibroids Mother   . Thyroid disease Mother   . Diabetes Paternal Aunt   . Diabetes Maternal Grandmother   . Heart disease Father      History  Substance Use Topics  . Smoking status: Never Smoker   . Smokeless tobacco: Never Used  . Alcohol Use: No     Comment: Pt drinks socially, maybe once a month   Current Outpatient Prescriptions on File Prior to Visit   Medication Sig Dispense Refill  . Lactulose 20 GM/30ML SOLN Take 30 mLs (20 g total) by mouth every 6 (six) hours as needed. Until constipation is relieved 236 mL 3  . norethindrone (MICRONOR,CAMILA,ERRIN) 0.35 MG tablet TAKE ONE TABLET BY MOUTH DAILY 84 tablet 3  . citalopram (CELEXA) 10 MG tablet Take 1 tablet (10 mg total) by mouth daily. (Patient not taking: Reported on 02/19/2015) 90 tablet 2  . polyethylene glycol powder (GLYCOLAX/MIRALAX) powder Take 17 g by mouth daily. (Patient not taking: Reported on 02/19/2015) 3350 g 1   No current facility-administered medications on file prior to visit.    No Known Allergies    Review of Systems Pertinent items are noted in HPI.    Objective:    BP 145/85 mmHg  Pulse 83  Ht 5' 1"  (1.549 m)  Wt 182 lb 9.6 oz (82.827 kg)  BMI 34.52 kg/m2  LMP 11/28/2014  General:   alert and no distress  Skin:    normal  Neck:  supple, symmetrical, trachea midline and thyroid not enlarged, symmetric, no tenderness/mass/nodules  Abdomen:  soft, non-tender; bowel sounds normal; no masses,  no organomegaly  Pelvic:   adnexae not palpable, cervix  normal in appearance, external genitalia normal, no adnexal masses or tenderness, no bladder tenderness, no cervical motion tenderness, rectovaginal septum normal, uterus normal size, shape, and consistency and vaginal discharge (moderate, white, without odor).      Assessment:    dysfunctional uterine bleeding, irregular menses and obesity    Plan:    Chlamydia specimen. GC specimen. Pap smear. Pelvic ultrasound.   Patient has abnormal uterine bleeding. She has a normal exam, no evidence of lesions.  Will order abnormal uterine bleeding evaluation labs and pelvic ultrasound to evaluate for any structural gynecologic abnormalities.  To discuss these results and make plans for further evaluation/management.  Patient currently desiring endometrial ablation (also with BTL). Given handouts on procedure.  Will  likely need endometrial biopsy at next visit.  WIll need CBC and TSH next visit.  RTC in 2 weeks.    [name], MD [facility]

## 2015-02-22 LAB — NUSWAB VAGINITIS PLUS (VG+): Candida glabrata, NAA: NEGATIVE

## 2015-02-22 LAB — PAP IG AND HPV HIGH-RISK: PAP Smear Comment: 0

## 2015-02-25 ENCOUNTER — Telehealth: Payer: Self-pay

## 2015-02-25 DIAGNOSIS — B3731 Acute candidiasis of vulva and vagina: Secondary | ICD-10-CM

## 2015-02-25 DIAGNOSIS — B373 Candidiasis of vulva and vagina: Secondary | ICD-10-CM

## 2015-02-25 MED ORDER — MICONAZOLE NITRATE 100 MG VA SUPP: 100.0000 mg | Freq: Every day | VAGINAL | Status: DC

## 2015-02-25 NOTE — Telephone Encounter (Signed)
Informed pt of negative PAP, and yeast infection. RX sent in for yeast infection.

## 2015-02-25 NOTE — Telephone Encounter (Signed)
-----   Message from [name], MD sent at 02/24/2015  4:26 PM EDT ----- Please inform patient of negative pap smear.  Yeast found on pap.  Needs treatment with Miconazole 100 mg 7 day treatment.

## 2015-03-05 ENCOUNTER — Ambulatory Visit (INDEPENDENT_AMBULATORY_CARE_PROVIDER_SITE_OTHER): Payer: 59 | Admitting: Obstetrics and Gynecology

## 2015-03-05 ENCOUNTER — Ambulatory Visit: Payer: 59

## 2015-03-05 DIAGNOSIS — N921 Excessive and frequent menstruation with irregular cycle: Secondary | ICD-10-CM | POA: Diagnosis not present

## 2015-03-12 ENCOUNTER — Encounter: Payer: Self-pay | Admitting: Obstetrics and Gynecology

## 2015-03-12 ENCOUNTER — Ambulatory Visit (INDEPENDENT_AMBULATORY_CARE_PROVIDER_SITE_OTHER): Payer: 59 | Admitting: Obstetrics and Gynecology

## 2015-03-12 VITALS — BP 140/90 | HR 73 | Ht 61.0 in | Wt 183.2 lb

## 2015-03-12 DIAGNOSIS — N83201 Unspecified ovarian cyst, right side: Secondary | ICD-10-CM

## 2015-03-12 DIAGNOSIS — N8320 Unspecified ovarian cysts: Secondary | ICD-10-CM

## 2015-03-12 DIAGNOSIS — N8000 Endometriosis of the uterus, unspecified: Secondary | ICD-10-CM

## 2015-03-12 DIAGNOSIS — N921 Excessive and frequent menstruation with irregular cycle: Secondary | ICD-10-CM | POA: Diagnosis not present

## 2015-03-12 DIAGNOSIS — D259 Leiomyoma of uterus, unspecified: Secondary | ICD-10-CM | POA: Diagnosis not present

## 2015-03-12 DIAGNOSIS — N83202 Unspecified ovarian cyst, left side: Secondary | ICD-10-CM

## 2015-03-12 NOTE — Patient Instructions (Addendum)
Patient to return in 1 week for endometrial biopsy.  To take 1 tablet of Ibuprofen/Aleve prior to scheduled visit.

## 2015-03-12 NOTE — Progress Notes (Addendum)
GYNECOLOGY PROGRESS NOTE  Subjective:    Patient ID: Mariah Brandt, female    DOB: May 29, 1971, 44 y.o.   MRN: [id number]  HPI  Patient is a 44 y.o. G60P1001 female who presents for f/u after ultrasound and discussion of management for abnormal uterine bleeding.   The following portions of the patient's history were reviewed and updated as appropriate: allergies, current medications, past family history, past medical history, past social history, past surgical history and problem list.  Review of Systems A comprehensive review of systems was negative today.   Objective:    Blood pressure 140/90, pulse 73, height 5' 1"  (1.549 m), weight 183 lb 3 oz (83.093 kg), last menstrual period 11/28/2014. Exam deferred   Imaging  Pelvic Ultrasound 03/06/2015:  Findings:  The uterus measures 10.8 x 5.5 x 6.0 cm. Echo texture is heterogenous without evidence of focal masses.  The Endometrium measures 1.7 mm. ? Adenomyosis of the endometrium.  Right Ovary measures 3.1 x 2.4 x 2.4 cm. It contains a simple appearing cyst which measures 1.6 x 1.7 x 2.5 cm. Left Ovary measures 3.4 x 2.0 x 2.3 cm. Possible small hemorrhagic vs collapsed cyst is seen which measures 1.3 x 1.0 x 1.3 cm. Survey of the adnexa demonstrates no adnexal masses. There is no free fluid in the cul de sac.  Assessment:   1. Possible adenomyosis 2. Bilateral small benign ovarian cysts.  3.  Menorrhagia with irregular cycles  Plan:   1. Discussion had regarding results of ultrasound.   2. Discussion had on management options.  Patient desires an endometrial ablation.  Discussed need for endometrial biopsy.  Patient declines biopsy today, stating that she is going on vacation in 2 days.  Will perform when she returns in 1 week.  Desires to have surgery the following week.  Will schedule for Hysteroscopy D&C with endometrial ablation for 03/25/15, and do pre-op at next visit.   A total of 15 minutes were spent  face-to-face with the patient during this encounter and over half of that time dealt with counseling and coordination of care.   [name], MD [facility]

## 2015-03-13 ENCOUNTER — Telehealth: Payer: Self-pay | Admitting: Obstetrics and Gynecology

## 2015-03-13 DIAGNOSIS — N921 Excessive and frequent menstruation with irregular cycle: Secondary | ICD-10-CM | POA: Insufficient documentation

## 2015-03-13 NOTE — Telephone Encounter (Signed)
ERROR

## 2015-03-14 LAB — CBC
HEMATOCRIT: 40.2 % (ref 34.0–46.6)
Hemoglobin: 13.5 g/dL (ref 11.1–15.9)
MCH: 29 pg (ref 26.6–33.0)
MCHC: 33.6 g/dL (ref 31.5–35.7)
MCV: 86 fL (ref 79–97)
Platelets: 261 10*3/uL (ref 150–379)
RBC: 4.66 x10E6/uL (ref 3.77–5.28)
RDW: 16 % — ABNORMAL HIGH (ref 12.3–15.4)
WBC: 5.7 10*3/uL (ref 3.4–10.8)

## 2015-03-14 LAB — TSH: TSH: 1.41 u[IU]/mL (ref 0.450–4.500)

## 2015-03-27 ENCOUNTER — Encounter: Payer: Self-pay | Admitting: Obstetrics and Gynecology

## 2015-03-27 ENCOUNTER — Ambulatory Visit (INDEPENDENT_AMBULATORY_CARE_PROVIDER_SITE_OTHER): Payer: 59 | Admitting: Obstetrics and Gynecology

## 2015-03-27 VITALS — BP 123/82 | HR 94 | Temp 98.9°F | Resp 16 | Ht 61.0 in | Wt 182.1 lb

## 2015-03-27 DIAGNOSIS — N920 Excessive and frequent menstruation with regular cycle: Secondary | ICD-10-CM

## 2015-03-27 NOTE — H&P (Signed)
Subjective:    Patient is a 44 y.o. G1P1020fmale scheduled for Hysteroscopy D&C with NovaSure Endometrial ablation. Indications for procedure are menorrhagia with irregular cycle, dysmenorrhea, adenomyosis.   Mariah Brandt is an 44y.o. G1P1001 woman who presents for irregular menses. She had been bleeding monthly, however noted heavy menses since menarche. She reports that over the past 2 years menses has become even heavier, and now she has not had a cycle since April. Has taken several home UPTs which have all been negative. She changes her pad or tampon every 2 hours. Menses lasts for 5-6 days. Is passing small clots. Dysmenorrhea:mild-moderate, occuring during the cycle. Current contraception: oral progesterone-only contraceptive (taking continuously, prescribed by PCP). Has used Depo Provera, OCPs, in the past without relief of symptoms. Has also used Mirena IUD in the past, however notes expulsion after 3 years of use. History of abnormal Pap smear: no.   Pertinent Gynecological History: Menses: flow is excessive with use of 1 pad q 2 hours pads or tampons on heaviest days, regular every 6 days without intermenstrual spotting and moderate dysmenorrhea Contraception: oral progesterone-only contraceptive Last pap: normal Date: 02/2015  Discussed Blood/Blood Products: not applicable   Menstrual History: OB History    Gravida Para Term Preterm AB TAB SAB Ectopic Multiple Living   1 1 1       1       Menarche age: [age]Patient's last menstrual period was 03/18/2015 (exact date).    Past Medical History  Diagnosis Date  . Obesity (BMI 30-39.9)   . Diabetes mellitus 2010    diet controlled  . Anxiety   . Dysmenorrhea   . Menorrhagia     Past Surgical History  Procedure Laterality Date  . Cholecystectomy  1996    OB History  Gravida Para Term Preterm AB SAB TAB Ectopic Multiple Living  1 1 1       1     # Outcome Date GA Lbr Len/2nd Weight Sex Delivery Anes  PTL Lv  1 Term 1995 [id number] lb 15 oz (3.147 kg) M Vag-Spont   Y      Social History   Social History  . Marital Status: Married    Spouse Name: Mariah Brandt  . Number of Children: Mariah Brandt  . Years of Education: Mariah Brandt   Social History Main Topics  . Smoking status: Never Smoker   . Smokeless tobacco: Never Used  . Alcohol Use: No     Comment: Pt drinks socially, maybe once a month  . Drug Use: No  . Sexual Activity: Yes    Birth Control/ Protection: , Pill   Other Topics Concern  . None   Social History Narrative    Family History  Problem Relation Age of Onset  . Hyperlipidemia Mother   . Fibroids Mother   . Thyroid disease Mother   . Diabetes Paternal Aunt   . Diabetes Maternal Grandmother   . Heart disease Father      (Not in a hospital admission)  No Known Allergies  Review of Systems Constitutional: No recent fever/chills/sweats Respiratory: No recent cough/bronchitis Cardiovascular: No chest pain Gastrointestinal: No recent nausea/vomiting/diarrhea Genitourinary: No UTI symptoms Hematologic/lymphatic:No history of coagulopathy or recent blood thinner use    Objective:    BP 123/82 mmHg  Pulse 94  Temp(Src) 98.9 F (37.2 C) (Oral)  Resp 16  Ht 5' 1"  (1.549 m)  Wt 182 lb 1.6 oz (82.6 kg)  BMI 34.43 kg/m2  LMP 03/18/2015 (Exact Date)  General:   Normal  Skin:   normal  HEENT:  Normal  Neck:  Supple without Adenopathy or Thyromegaly  Lungs:   Heart:              Breasts:   Abdomen:  Pelvis:  M/S   Extremeties:  Neuro:    clear to auscultation bilaterally   Normal without murmur   Not Examined   soft, non-tender; bowel sounds normal; no masses,  no organomegaly   Exam deferred to OR  No CVAT  Warm/Dry   Normal        Labs:   Lab Results  Component Value Date   WBC 5.7 03/12/2015   HGB 13.9 07/25/2013   HCT 40.2 03/12/2015   MCV 90 06/26/2013   PLT 252 07/25/2013   Lab Results  Component Value Date   TSH 1.410 03/12/2015     02/19/2015: Pap smear negative.  03/27/15: Endometrial biopsy performed today, results pending.    Imaging  Pelvic Ultrasound 03/06/2015:  Findings:  The uterus measures 10.8 x 5.5 x 6.0 cm. Echo texture is heterogenous without evidence of focal masses.  The Endometrium measures 1.7 mm. ? Adenomyosis of the endometrium.  Right Ovary measures 3.1 x 2.4 x 2.4 cm. It contains a simple appearing cyst which measures 1.6 x 1.7 x 2.5 cm. Left Ovary measures 3.4 x 2.0 x 2.3 cm. Possible small hemorrhagic vs collapsed cyst is seen which measures 1.3 x 1.0 x 1.3 cm. Survey of the adnexa demonstrates no adnexal masses. There is no free fluid in the cul de sac.  Assessment:    Menorrhagia Dysmenorrhea Adenomyosis   Plan:    Counseling: Procedure, risks, reasons, benefits and complications (including injury to bowel, bladder, major blood vessel, bleeding, possibility of transfusion, infection, or fistula formation) reviewed in detail. Consent signed. Preop testing ordered. Instructions reviewed, including NPO after midnight.   [name], MD [facility]

## 2015-03-27 NOTE — Progress Notes (Signed)
GYNECOLOGY PROGRESS NOTE  Subjective:    Patient ID: Mariah Brandt, female    DOB: January 14, 1971, 44 y.o.   MRN: [id number]  HPI  Patient is a 44 y.o. G32P1001 female who presents for endometrial biopsy for workup of abnormal uterine bleeding.   The following portions of the patient's history were reviewed and updated as appropriate: allergies, current medications, past family history, past medical history, past social history, past surgical history and problem list.  Review of Systems A comprehensive review of systems was negative today.   Objective:    Blood pressure 123/82, pulse 94, temperature 98.9 F (37.2 C), temperature source Oral, resp. rate 16, height 5' 1"  (1.549 m), weight 182 lb 1.6 oz (82.6 kg), last menstrual period 03/18/2015.  See Endometrial biopsy note.   Assessment:   1. Suspected adenomyosis (based on recent ultrasound) 2. Menorrhagia with irregular cycles  Plan:   1. Endometrial biopsy performed today.  2.Patient desires endometrial ablation for management of abnormal bleeding.  The risks of surgery were discussed in detail with the patient including but not limited to: bleeding which may require transfusion or reoperation; infection which may require prolonged hospitalization or re-hospitalization and antibiotic therapy; injury to bowel, bladder, ureters and major vessels or other surrounding organs; need for additional procedures including laparotomy; thromboembolic phenomenon, incisional problems and other postoperative or anesthesia complications.  Patient was told that the likelihood that her condition and symptoms will be treated effectively with this surgical management was very high; the postoperative expectations were also discussed in detail. The patient also understands the alternative treatment options which were discussed in full. All questions were answered.  She was told that she will be contacted by our surgical scheduler regarding the time and  date of her surgery; routine preoperative instructions of having nothing to eat or drink after midnight on the day prior to surgery and also coming to the hospital 1.5 hours prior to her time of surgery were also emphasized.  Patient scheduled for Hysteroscopy D&C with NovaSure endometrial ablation on 04/22/2015.  Consent form signed today.     Endometrial Biopsy Procedure Note  Pre-operative Diagnosis: Menorrhagia, Dysmenorrhea, Adenomyosis  Post-operative Diagnosis: same  Indications: abnormal uterine bleeding  Procedure Details   Urine pregnancy test was not done. LMP 03/18/2015 and patient on OCPs and has not been sexually active in the past week. The risks (including infection, bleeding, pain, and uterine perforation) and benefits of the procedure were explained to the patient and Verbal informed consent was obtained.     The patient was placed in the dorsal lithotomy position.  Bimanual exam showed the uterus to be in the anteroflexed position.  A Graves' speculum inserted in the vagina, and the cervix prepped with povidone iodine.   A sharp tenaculum was applied to the anterior lip of the cervix for stabilization.  A sterile uterine sound was used to sound the uterus to a depth of 10 cm.  A Pipelle endometrial aspirator was used to sample the endometrium.  Sample was sent for pathologic examination.  Condition: Stable  Complications: None  Plan: The patient was advised to call for any fever or for prolonged or severe pain or bleeding. She was advised to use OTC analgesics as needed for mild to moderate pain. She was advised to avoid vaginal intercourse for 48 hours or until the bleeding has completely stopped.   [name], MD [facility]

## 2015-03-29 LAB — PATHOLOGY

## 2015-04-03 ENCOUNTER — Telehealth: Payer: Self-pay | Admitting: Obstetrics and Gynecology

## 2015-04-03 ENCOUNTER — Ambulatory Visit (INDEPENDENT_AMBULATORY_CARE_PROVIDER_SITE_OTHER): Payer: 59 | Admitting: Obstetrics and Gynecology

## 2015-04-03 VITALS — BP 135/92 | HR 114 | Temp 98.5°F | Resp 16 | Ht 61.0 in | Wt 178.2 lb

## 2015-04-03 DIAGNOSIS — R1030 Lower abdominal pain, unspecified: Secondary | ICD-10-CM | POA: Diagnosis not present

## 2015-04-03 DIAGNOSIS — M545 Low back pain: Secondary | ICD-10-CM

## 2015-04-03 DIAGNOSIS — R319 Hematuria, unspecified: Secondary | ICD-10-CM | POA: Diagnosis not present

## 2015-04-03 LAB — POCT URINALYSIS DIPSTICK
BILIRUBIN UA: NEGATIVE
GLUCOSE UA: NEGATIVE
KETONES UA: NEGATIVE
LEUKOCYTES UA: NEGATIVE
NITRITE UA: NEGATIVE
PH UA: 7
Protein, UA: NEGATIVE
Spec Grav, UA: 1.01
Urobilinogen, UA: NEGATIVE

## 2015-04-03 NOTE — Telephone Encounter (Signed)
PT HAD A BX LAST WEEK ON THE 17TH AND LAST NIGHT SHE HAD CHILLS AND THIS MORNING SHE IS HAVING A BROWN DISCHARGE WITH ODOR AND SHE TOOK HER TEMP AND IT WAS 99.9, AND SHE IS HAVING LOWER BACK PAIN, SHE WANTED TO KNOW IF SHE NEEDED TO COME IN IF THIS COULD BE RELATED TO THE BX,

## 2015-04-03 NOTE — Telephone Encounter (Signed)
Pt calls with chills, fever, brown discharge with odor (fishy), lower back pain. Workin appt at 1:45 today.

## 2015-04-04 NOTE — Progress Notes (Signed)
GYNECOLOGY CLINIC PROGRESS NOTE  Subjective:    Patient ID: Mariah Brandt, female    DOB: [birth year], 44 y.o.   MRN: [id number]  HPI  Patient is a 44 y.o. G52P1001 female who presents for complaints of vaginal discharge, low grade fevers (99.9 is Tmax), abdominal pain and back pain.  Reports onset yesterday.  Is ~ 1 week s/p endometrial biopsy and thinks it may possibly be related. Denies vaginal bleeding. Denies dysuria or hematuria.   The following portions of the patient's history were reviewed and updated as appropriate: allergies, current medications, past family history, past medical history, past social history, past surgical history and problem list.  Review of Systems Pertinent items are noted in HPI.   Objective:   Blood pressure 135/92, pulse 114, temperature 98.5 F (36.9 C), temperature source Oral, resp. rate 16, height 5' 1"  (1.549 m), weight 178 lb 3.2 oz (80.831 kg), last menstrual period 03/18/2015. General appearance: alert and no distress Abdomen: normal findings: no masses palpable and soft and abnormal findings:  mild tenderness in suprapubic region Pelvic: cervix normal in appearance, external genitalia normal, no adnexal masses or tenderness, no cervical motion tenderness, positive findings: vaginal discharge:  scant, white, thin and non-malodorous, rectovaginal septum normal and uterus normal size, shape, and consistency Extremities: extremities normal, atraumatic, no cyanosis or edema Neurologic: Grossly normal   Microscopic wet-mount exam shows negative for pathogens, normal epithelial cells.  Urinalysis Results for orders placed or performed in visit on 04/03/15  POCT urinalysis dipstick  Result Value Ref Range   Color, UA yellow    Clarity, UA cloudy    Glucose, UA negative    Bilirubin, UA negative    Ketones, UA negative    Spec Grav, UA 1.010    Blood, UA moderate    pH, UA 7.0    Protein, UA negative    Urobilinogen, UA negative    Nitrite, UA negative    Leukocytes, UA Negative Negative     Pathology 03/27/2015:  ENDOMETRIAL BIOPSY: NO HYPERPLASIA OR CARCINOMA. INACTIVE ENDOMETRIUM, BLOOD, AND MUCUS.   Assessment:   1. Hematuria 2. Abdominal pain/back pain with low grade temp (subjective)  Plan:   1. Moderate RBCs on UA today.  Patient denies gross hematuria, h/o renal stones.  No blood in vaginal vault as possible alternative source.  Will send urine for culture.  Will also order CT stone protocol to r/o other causes.  2. No signs of vaginitis/cervicitis/endometritis.  Wet prep negative.  Afebrile currently.  3. Informed patient of negative EMB results.  4. To f/u if symptoms worsen.    [name], MD [facility]

## 2015-04-05 LAB — URINE CULTURE

## 2015-04-11 ENCOUNTER — Encounter
Admission: RE | Admit: 2015-04-11 | Discharge: 2015-04-11 | Disposition: A | Discharge status: Home / Self Care | Payer: 59 | Source: Ambulatory Visit | Attending: Obstetrics and Gynecology | Admitting: Obstetrics and Gynecology

## 2015-04-11 DIAGNOSIS — M545 Low back pain: Secondary | ICD-10-CM | POA: Insufficient documentation

## 2015-04-11 DIAGNOSIS — Z01812 Encounter for preprocedural laboratory examination: Secondary | ICD-10-CM | POA: Insufficient documentation

## 2015-04-11 HISTORY — DX: Other constipation: K59.09

## 2015-04-11 LAB — CBC
HEMATOCRIT: 43 % (ref 35.0–47.0)
HEMOGLOBIN: 14.2 g/dL (ref 12.0–16.0)
MCH: 29.2 pg (ref 26.0–34.0)
MCHC: 33 g/dL (ref 32.0–36.0)
MCV: 88.4 fL (ref 80.0–100.0)
Platelets: 274 10*3/uL (ref 150–440)
RBC: 4.87 MIL/uL (ref 3.80–5.20)
RDW: 14.6 % — ABNORMAL HIGH (ref 11.5–14.5)
WBC: 5.2 10*3/uL (ref 3.6–11.0)

## 2015-04-11 NOTE — Patient Instructions (Addendum)
  Your procedure is scheduled on: Monday Sept 12, 2016. Report to Same Day Surgery. To find out your arrival time please call [phone] between 1PM - 3PM on Friday Sept.9, 2016 .  Remember: Instructions that are not followed completely may result in serious medical risk, up to and including death, or upon the discretion of your surgeon and anesthesiologist your surgery may need to be rescheduled.    _x___ 1. Do not eat food or drink liquids after midnight. No gum chewing or hard candies.     ____ 2. No Alcohol for 24 hours before or after surgery.   ____ 3. Bring all medications with you on the day of surgery if instructed.    _x___ 4. Notify your doctor if there is any change in your medical condition     (cold, fever, infections).     Do not wear jewelry, make-up, hairpins, clips or nail polish.  Do not wear lotions, powders, or perfumes. You may wear deodorant.  Do not shave 48 hours prior to surgery. Men may shave face and neck.  Do not bring valuables to the hospital.    [facility] is not responsible for any belongings or valuables.               Contacts, dentures or bridgework may not be worn into surgery.  Leave your suitcase in the car. After surgery it may be brought to your room.  For patients admitted to the hospital, discharge time is determined by your treatment team.   Patients discharged the day of surgery will not be allowed to drive home.    Please read over the following fact sheets that you were given:   [facility] Preparing for Surgery  ___ Take these medicines the morning of surgery with A SIP OF WATER: None     ____ Fleet Enema (as directed)   ____ Use CHG Soap as directed  ____ Use inhalers on the day of surgery  ____ Stop metformin 2 days prior to surgery    ____ Take 1/2 of usual insulin dose the night before surgery and none on the morning of surgery.   ____ Stop Coumadin/Plavix/aspirin on does not apply.  _x___ Stop Anti-inflammatories  (Aleve) now.  May take Tylenol for pain if needed.   ____ Stop supplements until after surgery.    ____ Bring C-Pap to the hospital.

## 2015-04-12 ENCOUNTER — Ambulatory Visit: Payer: 59

## 2015-04-26 ENCOUNTER — Telehealth: Payer: Self-pay | Admitting: Obstetrics and Gynecology

## 2015-04-26 NOTE — Telephone Encounter (Signed)
ERROR

## 2015-04-29 ENCOUNTER — Ambulatory Visit: Payer: 59 | Admitting: Anesthesiology

## 2015-04-29 ENCOUNTER — Encounter: Payer: Self-pay | Admitting: *Deleted

## 2015-04-29 ENCOUNTER — Encounter
Admission: RE | Disposition: A | Discharge status: Home / Self Care | Payer: Self-pay | Source: Ambulatory Visit | Attending: Obstetrics and Gynecology

## 2015-04-29 ENCOUNTER — Ambulatory Visit
Admission: RE | Admit: 2015-04-29 | Discharge: 2015-04-29 | Disposition: A | Discharge status: Home / Self Care | Payer: 59 | Source: Ambulatory Visit | Attending: Obstetrics and Gynecology | Admitting: Obstetrics and Gynecology

## 2015-04-29 DIAGNOSIS — N920 Excessive and frequent menstruation with regular cycle: Secondary | ICD-10-CM | POA: Diagnosis not present

## 2015-04-29 DIAGNOSIS — Z8249 Family history of ischemic heart disease and other diseases of the circulatory system: Secondary | ICD-10-CM | POA: Diagnosis not present

## 2015-04-29 DIAGNOSIS — E669 Obesity, unspecified: Secondary | ICD-10-CM | POA: Insufficient documentation

## 2015-04-29 DIAGNOSIS — Z79899 Other long term (current) drug therapy: Secondary | ICD-10-CM | POA: Diagnosis not present

## 2015-04-29 DIAGNOSIS — N946 Dysmenorrhea, unspecified: Secondary | ICD-10-CM | POA: Diagnosis not present

## 2015-04-29 DIAGNOSIS — Z833 Family history of diabetes mellitus: Secondary | ICD-10-CM | POA: Diagnosis not present

## 2015-04-29 HISTORY — PX: DILITATION & CURRETTAGE/HYSTROSCOPY WITH NOVASURE ABLATION: SHX5568

## 2015-04-29 LAB — POCT PREGNANCY, URINE: PREG TEST UR: NEGATIVE

## 2015-04-29 SURGERY — DILATATION & CURETTAGE/HYSTEROSCOPY WITH NOVASURE ABLATION
Anesthesia: General | Wound class: Clean Contaminated

## 2015-04-29 MED ORDER — FENTANYL CITRATE (PF) 100 MCG/2ML IJ SOLN
INTRAMUSCULAR | Status: DC | PRN
  Administered 2015-04-29 (×2): 25 ug via INTRAVENOUS
  Administered 2015-04-29: 100 ug via INTRAVENOUS

## 2015-04-29 MED ORDER — FENTANYL CITRATE (PF) 100 MCG/2ML IJ SOLN
25.0000 ug | INTRAMUSCULAR | Status: DC | PRN
  Administered 2015-04-29: 50 ug via INTRAVENOUS
  Administered 2015-04-29 (×2): 25 ug via INTRAVENOUS

## 2015-04-29 MED ORDER — HYDROCODONE-ACETAMINOPHEN 5-325 MG PO TABS: 1.0000 | ORAL_TABLET | Freq: Four times a day (QID) | ORAL | Status: DC | PRN

## 2015-04-29 MED ORDER — LABETALOL HCL 5 MG/ML IV SOLN
5.0000 mg | INTRAVENOUS | Status: DC | PRN
  Administered 2015-04-29 (×3): 5 mg via INTRAVENOUS

## 2015-04-29 MED ORDER — FENTANYL CITRATE (PF) 100 MCG/2ML IJ SOLN
INTRAMUSCULAR | Status: AC
  Administered 2015-04-29: 25 ug via INTRAVENOUS
  Filled 2015-04-29: qty 2

## 2015-04-29 MED ORDER — MEDROXYPROGESTERONE ACETATE 150 MG/ML IM SUSP: 150.0000 mg | INTRAMUSCULAR | Status: DC

## 2015-04-29 MED ORDER — PROPOFOL 10 MG/ML IV BOLUS
INTRAVENOUS | Status: DC | PRN
  Administered 2015-04-29: 150 mg via INTRAVENOUS
  Administered 2015-04-29: 50 mg via INTRAVENOUS

## 2015-04-29 MED ORDER — FAMOTIDINE 20 MG PO TABS
20.0000 mg | ORAL_TABLET | Freq: Once | ORAL | Status: AC
  Administered 2015-04-29: 20 mg via ORAL

## 2015-04-29 MED ORDER — MIDAZOLAM HCL 5 MG/5ML IJ SOLN
INTRAMUSCULAR | Status: DC | PRN
  Administered 2015-04-29: 2 mg via INTRAVENOUS

## 2015-04-29 MED ORDER — PROMETHAZINE HCL 25 MG/ML IJ SOLN
6.2500 mg | INTRAMUSCULAR | Status: DC | PRN
Start: 2015-04-29 — End: 2015-04-29
  Administered 2015-04-29: 6.25 mg via INTRAVENOUS

## 2015-04-29 MED ORDER — HYDRALAZINE HCL 20 MG/ML IJ SOLN: 10.0000 mg | Freq: Once | INTRAMUSCULAR | Status: DC

## 2015-04-29 MED ORDER — PROMETHAZINE HCL 25 MG/ML IJ SOLN
INTRAMUSCULAR | Status: AC
  Administered 2015-04-29: 6.25 mg via INTRAVENOUS
  Filled 2015-04-29: qty 1

## 2015-04-29 MED ORDER — HYDRALAZINE HCL 20 MG/ML IJ SOLN
INTRAMUSCULAR | Status: DC
Start: 2015-04-29 — End: 2015-04-29
  Filled 2015-04-29: qty 1

## 2015-04-29 MED ORDER — IBUPROFEN 800 MG PO TABS
800.0000 mg | ORAL_TABLET | Freq: Three times a day (TID) | ORAL | Status: DC | PRN
Start: 2015-04-29 — End: 2015-10-21

## 2015-04-29 MED ORDER — LABETALOL HCL 5 MG/ML IV SOLN
10.0000 mg | Freq: Once | INTRAVENOUS | Status: AC
  Administered 2015-04-29: 10 mg via INTRAVENOUS

## 2015-04-29 MED ORDER — LABETALOL HCL 5 MG/ML IV SOLN
INTRAVENOUS | Status: AC
  Administered 2015-04-29: 5 mg via INTRAVENOUS
  Filled 2015-04-29: qty 4

## 2015-04-29 MED ORDER — LACTATED RINGERS IV SOLN
INTRAVENOUS | Status: DC
  Administered 2015-04-29: 125 mL/h via INTRAVENOUS

## 2015-04-29 SURGICAL SUPPLY — 16 items
CANISTER SUCT 1200ML W/VALVE (MISCELLANEOUS) IMPLANT
CATH ROBINSON RED A/P 16FR (CATHETERS) ×2 IMPLANT
DRSG TELFA 3X8 NADH (GAUZE/BANDAGES/DRESSINGS) ×2 IMPLANT
GLOVE BIO SURGEON STRL SZ 6 (GLOVE) IMPLANT
GLOVE BIOGEL PI IND STRL 6.5 (GLOVE) ×3 IMPLANT
GLOVE BIOGEL PI INDICATOR 6.5 (GLOVE) ×3
GOWN STRL REUS W/ TWL LRG LVL3 (GOWN DISPOSABLE) ×2 IMPLANT
GOWN STRL REUS W/TWL LRG LVL3 (GOWN DISPOSABLE) ×2
KIT RM TURNOVER CYSTO AR (KITS) ×2 IMPLANT
NOVASURE ENDOMETRIAL ABLATION (MISCELLANEOUS) ×2 IMPLANT
NS IRRIG 500ML POUR BTL (IV SOLUTION) ×2 IMPLANT
PACK DNC HYST (MISCELLANEOUS) ×2 IMPLANT
PAD OB MATERNITY 4.3X12.25 (PERSONAL CARE ITEMS) ×2 IMPLANT
SPONGE XRAY 4X4 16PLY STRL (MISCELLANEOUS) IMPLANT
TOWEL OR 17X26 4PK STRL BLUE (TOWEL DISPOSABLE) ×2 IMPLANT
TUBING CONNECTING 10 (TUBING) IMPLANT

## 2015-04-29 NOTE — Discharge Instructions (Signed)
Specific Instructions:  You will be scheduled for a post-operative follow up appointment in 2 weeks.  Please bring Depo Provera medication with you to your appointment.    General Gynecological Post-Operative Instructions You may expect to feel dizzy, weak, and drowsy for as long as 24 hours after receiving the medicine that made you sleep (anesthetic).  Do not drive a car, ride a bicycle, participate in physical activities, or take public transportation until you are done taking narcotic pain medicines or as directed by your doctor.  Do not drink alcohol or take tranquilizers.  Do not take medicine that has not been prescribed by your doctor.  Do not sign important papers or make important decisions while on narcotic pain medicines.  Have a responsible person with you.  CARE OF SURGICAL SITE: Take showers instead of baths until your doctor gives you permission to take baths.  Avoid heavy lifting (more than 10 pounds/4.5 kilograms), pushing, or pulling.  Avoid activities that may risk injury to your surgical site.  No sexual intercourse or placement of anything in the vagina for 3 weeks or as instructed by your doctor. Only take prescription or over-the-counter medicines  for pain, discomfort, or fever as directed by your doctor. Do not take aspirin. It can make you bleed. Take medicines (antibiotics) that kill germs if they are prescribed for you.  Call the office or go to the Emergency Room if:  You feel sick to your stomach (nauseous).  You start to throw up (vomit).  You have trouble eating or drinking.  You have an oral temperature above 101.  You have constipation that is not helped by adjusting diet or increasing fluid intake. Pain medicines are a common cause of constipation.  You have any other concerns. SEEK IMMEDIATE MEDICAL CARE IF:  You have persistent dizziness.  You have difficulty breathing or a congested sounding (croupy) cough.  You have an oral temperature above 102.5,  not controlled by medicine.  There is increasing pain or tenderness near or in the surgical site.   AMBULATORY SURGERY  DISCHARGE INSTRUCTIONS   1) The drugs that you were given will stay in your system until tomorrow so for the next 24 hours you should not:  A) Drive an automobile B) Make any legal decisions C) Drink any alcoholic beverage   2) You may resume regular meals tomorrow.  Today it is better to start with liquids and gradually work up to solid foods.  You may eat anything you prefer, but it is better to start with liquids, then soup and crackers, and gradually work up to solid foods.   3) Please notify your doctor immediately if you have any unusual bleeding, trouble breathing, redness and pain at the surgery site, drainage, fever, or pain not relieved by medication.    4) Additional Instructions:        Please contact your physician with any problems or Same Day Surgery at [phone], Monday through Friday 6 am to 4 pm, or [place] at [facility] number at [phone].AMBULATORY SURGERY  DISCHARGE INSTRUCTIONS   5) The drugs that you were given will stay in your system until tomorrow so for the next 24 hours you should not:  D) Drive an automobile E) Make any legal decisions F) Drink any alcoholic beverage   6) You may resume regular meals tomorrow.  Today it is better to start with liquids and gradually work up to solid foods.  You may eat anything you prefer, but it is  better to start with liquids, then soup and crackers, and gradually work up to solid foods.   7) Please notify your doctor immediately if you have any unusual bleeding, trouble breathing, redness and pain at the surgery site, drainage, fever, or pain not relieved by medication.    8) Additional Instructions:        Please contact your physician with any problems or Same Day Surgery at [phone], Monday through Friday 6 am to 4 pm, or [place] at [facility]  number at [phone].

## 2015-04-29 NOTE — Transfer of Care (Signed)
Immediate Anesthesia Transfer of Care Note  Patient: Mariah Brandt  Procedure(s) Performed: Procedure(s): DILATATION & CURETTAGE/HYSTEROSCOPY WITH NOVASURE ABLATION (N/A)  Patient Location: PACU  Anesthesia Type:General  Level of Consciousness: awake and alert   Airway & Oxygen Therapy: Patient Spontanous Breathing and Patient connected to face mask oxygen  Post-op Assessment: Post -op Vital signs reviewed and stable  Post vital signs: Reviewed and stable  Last Vitals:  Filed Vitals:   04/29/15 1233  BP: 143/98  Pulse: 101  Temp:   Resp: 15    Complications: No apparent anesthesia complications

## 2015-04-29 NOTE — Op Note (Signed)
Hysteroscopy Procedure Note  Indications: Mariah Brandt is a 44 y.o. G48P1001 female with a history of worsening menorrhagia, adenomysis, dysmenorrhea.    Pre-operative Diagnosis: Menorrhagia, adenomysis, dysmenorrhea  Post-operative Diagnosis: Same  Surgeon: Surgeon(s) and Role:    * [name], MD - Primary   Assistants: None  Procedure:     * DILATATION & CURETTAGE/HYSTEROSCOPY WITH NOVASURE ABLATION - Choice  Anesthesia: General endotracheal anesthesia  ASA Class: 2  Procedure Details: Patient was seen in the preoperative holding area where the consent was reviewed. Patient was consented for the following procedure: Hysteroscopy Dilation and Curettage with NovaSure endometrial ablation. Pt was taken to the operating room # 5.    The patient was then placed under general anesthesia without difficulty.  She was then prepped and draped in the normal sterile fashion, and placed in the dorsal lithotomy position.  A time out was performed.  An exam under anesthesia was performed with the findings noted above.  Straight catheterization was performed with return of ~ 10 ml of clear urine. A sterile speculum was inserted into vagina. A single-tooth tenaculum was used to grasp the anterior lip of the cervix. Cervical dilation was performed. A 5 mm hysteroscope was introduced into the uterus under direct visualization. The cavity was allowed to fill, and then the entire cavity was explored with the findings described above. The hysteroscope was removed, and a sharp curette was then passed into the uterus and endometrial sampling was collected for pathology.   Next the NovaSure instrument was primed per instructions. The instrument was then placed into the endometrial canal and activated.  The NovaSure instrument was then removed from the uterine cavity.  The hysteroscope was then re-introduced for final survey, with adequate charring of the endometrium noted, except for small region  near fundus.  The hysteroscope was removed from the patient's uterine cavity. The tenaculum was removed and excellent hemostasis was noted. The speculum was removed from the vagina.   All instrument and sponge counts were correct at the end of the procedure x 2.  The patient tolerated the procedure well.  She was awakened and taken to the PACU in stable condition.   Findings: Uterus sounded to 11 cm.  Cervical length 4 cm.  Proliferative endometrium.  Tubal ostia were visualized bilaterally.  No intrauterine masses.  Total ablation time 0 min 56 secs.   Adequate charring of endometrial tissue post ablation.  No perforations noted.   Estimated Blood Loss:  20 ml      Drains: straight catheterization with 10 cc at start of procedure.          Total IV Fluids:  600 ml  Specimens:  Endometrial curettings         Implants: None         Complications:  None; patient tolerated the procedure well.         Disposition: PACU - hemodynamically stable.         Condition: stable   [name], MD [facility]

## 2015-04-29 NOTE — Anesthesia Preprocedure Evaluation (Signed)
Anesthesia Evaluation  Patient identified by MRN, date of birth, ID band Patient awake    Reviewed: Allergy & Precautions, H&P , NPO status , Patient's Chart, lab work & pertinent test results, reviewed documented beta blocker date and time   History of Anesthesia Complications Negative for: history of anesthetic complications  Airway Mallampati: III  TM Distance: >3 FB Neck ROM: full    Dental no notable dental hx. (+) Teeth Intact   Pulmonary neg pulmonary ROS,    Pulmonary exam normal breath sounds clear to auscultation       Cardiovascular Exercise Tolerance: Good negative cardio ROS Normal cardiovascular exam Rhythm:regular Rate:Normal     Neuro/Psych PSYCHIATRIC DISORDERS (Anxiety) negative neurological ROS     GI/Hepatic negative GI ROS, Neg liver ROS,   Endo/Other  diabetes (Borderline, diet controlled)  Renal/GU negative Renal ROS  negative genitourinary   Musculoskeletal   Abdominal   Peds  Hematology negative hematology ROS (+)   Anesthesia Other Findings Past Medical History:   Obesity (BMI 30-39.9)                                        Anxiety                                                      Dysmenorrhea                                                 Menorrhagia                                                  Constipation, chronic                           1996           Comment:after chloecystetomy   Reproductive/Obstetrics negative OB ROS                             Anesthesia Physical Anesthesia Plan  ASA: II  Anesthesia Plan: General   Post-op Pain Management:    Induction:   Airway Management Planned:   Additional Equipment:   Intra-op Plan:   Post-operative Plan:   Informed Consent: I have reviewed the patients History and Physical, chart, labs and discussed the procedure including the risks, benefits and alternatives for the proposed  anesthesia with the patient or authorized representative who has indicated his/her understanding and acceptance.   Dental Advisory Given  Plan Discussed with: Anesthesiologist, CRNA and Surgeon  Anesthesia Plan Comments:         Anesthesia Quick Evaluation

## 2015-04-29 NOTE — Anesthesia Procedure Notes (Signed)
Procedure Name: LMA Insertion Date/Time: 04/29/2015 11:31 AM Performed by: Kennon Holter Pre-anesthesia Checklist: Timeout performed, Patient being monitored, Suction available, Patient identified and Emergency Drugs available Patient Re-evaluated:Patient Re-evaluated prior to inductionOxygen Delivery Method: Circle system utilized Preoxygenation: Pre-oxygenation with 100% oxygen Intubation Type: IV induction Ventilation: Mask ventilation without difficulty LMA: LMA inserted LMA Size: 4.0 Tube type: Oral Number of attempts: 1 Placement Confirmation: positive ETCO2 and breath sounds checked- equal and bilateral Tube secured with: Tape Dental Injury: Teeth and Oropharynx as per pre-operative assessment

## 2015-04-29 NOTE — H&P (Addendum)
Subjective:    Patient is a 44 y.o. G1P1012fmale scheduled for Hysteroscopy D&C with NovaSure Endometrial ablation. Indications for procedure are menorrhagia with irregular cycle, dysmenorrhea, adenomyosis.   Mariah Brandt is an 44y.o. G1P1001 woman who presents for irregular menses. She had been bleeding monthly, however noted heavy menses since menarche. She reports that over the past 2 years menses has become even heavier, and now she has not had a cycle since April. Has taken several home UPTs which have all been negative. She changes her pad or tampon every 2 hours. Menses lasts for 5-6 days. Is passing small clots. Dysmenorrhea:mild-moderate, occuring during the cycle. Current contraception: oral progesterone-only contraceptive (taking continuously, prescribed by PCP). Has used Depo Provera, OCPs, in the past without relief of symptoms. Has also used Mirena IUD in the past, however notes expulsion after 3 years of use. History of abnormal Pap smear: no.   Pertinent Gynecological History: Menses: flow is excessive with use of 1 pad q 2 hours pads or tampons on heaviest days, regular every 6 days without intermenstrual spotting and moderate dysmenorrhea Contraception: oral progesterone-only contraceptive Last pap: normal Date: 02/2015  Discussed Blood/Blood Products: not applicable   Menstrual History: OB History    Gravida Para Term Preterm AB TAB SAB Ectopic Multiple Living   1 1 1       1       Menarche age: [age]Patient's last menstrual period was 04/18/2015 (approximate).    Past Medical History  Diagnosis Date  . Obesity (BMI 30-39.9)   . Anxiety   . Dysmenorrhea   . Menorrhagia   . Constipation, chronic 1996    after chloecystetomy    Past Surgical History  Procedure Laterality Date  . Cholecystectomy  1996    OB History  Gravida Para Term Preterm AB SAB TAB Ectopic Multiple Living  1 1 1       1     # Outcome Date GA Lbr Len/2nd Weight Sex  Delivery Anes PTL Lv  1 Term 1995 [id number] lb 15 oz (3.147 kg) M Vag-Spont   Y      Social History   Social History  . Marital Status: Married    Spouse Name: N/A  . Number of Children: N/A  . Years of Education: N/A   Social History Main Topics  . Smoking status: Never Smoker   . Smokeless tobacco: Never Used  . Alcohol Use: No     Comment: Pt drinks socially, maybe once a month  . Drug Use: No  . Sexual Activity: Yes    Birth Control/ Protection: , Pill   Other Topics Concern  . None   Social History Narrative    Family History  Problem Relation Age of Onset  . Hyperlipidemia Mother   . Fibroids Mother   . Thyroid disease Mother   . Diabetes Paternal Aunt   . Diabetes Maternal Grandmother   . Heart disease Father     Prescriptions prior to admission  Medication Sig Dispense Refill Last Dose  . Lactulose 20 GM/30ML SOLN Take 30 mLs (20 g total) by mouth every 6 (six) hours as needed. Until constipation is relieved 236 mL 3 Past Month at Unknown time  . norethindrone (MICRONOR,CAMILA,ERRIN) 0.35 MG tablet TAKE ONE TABLET BY MOUTH DAILY 84 tablet 3 04/08/2015  . polyethylene glycol powder (GLYCOLAX/MIRALAX) powder Take 17 g by mouth daily. 3350 g 1 03/11/2015    No Known Allergies  Review of Systems Constitutional: No recent  fever/chills/sweats Respiratory: No recent cough/bronchitis Cardiovascular: No chest pain Gastrointestinal: No recent nausea/vomiting/diarrhea Genitourinary: No UTI symptoms Hematologic/lymphatic:No history of coagulopathy or recent blood thinner use    Objective:    BP 123/86 mmHg  Pulse 80  Temp(Src) 98.6 F (37 C) (Oral)  Resp 16  Ht 5' 1"  (1.549 m)  Wt 176 lb (79.833 kg)  BMI 33.27 kg/m2  SpO2 100%  LMP 04/18/2015 (Approximate)  General:   Normal  Skin:   normal  HEENT:  Normal  Neck:  Supple without Adenopathy or Thyromegaly  Lungs:   Heart:              Breasts:   Abdomen:  Pelvis:  M/S   Extremeties:  Neuro:     clear to auscultation bilaterally   Normal without murmur   Not Examined   soft, non-tender; bowel sounds normal; no masses,  no organomegaly   Exam deferred to OR  No CVAT  Warm/Dry  Normal        Labs:   Lab Results  Component Value Date   WBC 5.2 04/11/2015   HGB 14.2 04/11/2015   HCT 43.0 04/11/2015   MCV 88.4 04/11/2015   PLT 274 04/11/2015   Lab Results  Component Value Date   TSH 1.410 03/12/2015    02/19/2015: Pap smear negative.  03/27/15: Endometrial biopsy: NO HYPERPLASIA OR CARCINOMA. INACTIVE ENDOMETRIUM, BLOOD, AND MUCUS.      Imaging  Pelvic Ultrasound 03/06/2015:  Findings:  The uterus measures 10.8 x 5.5 x 6.0 cm. Echo texture is heterogenous without evidence of focal masses.  The Endometrium measures 1.7 mm. ? Adenomyosis of the endometrium.  Right Ovary measures 3.1 x 2.4 x 2.4 cm. It contains a simple appearing cyst which measures 1.6 x 1.7 x 2.5 cm. Left Ovary measures 3.4 x 2.0 x 2.3 cm. Possible small hemorrhagic vs collapsed cyst is seen which measures 1.3 x 1.0 x 1.3 cm. Survey of the adnexa demonstrates no adnexal masses. There is no free fluid in the cul de sac.  Assessment:    Menorrhagia Dysmenorrhea Adenomyosis   Plan:   Patient desires endometrial ablation for management of abnormal bleeding. The risks of surgery were discussed in detail with the patient including but not limited to: bleeding which may require transfusion or reoperation; infection which may require prolonged hospitalization or re-hospitalization and antibiotic therapy; injury to bowel, bladder, ureters and major vessels or other surrounding organs; need for additional procedures including laparotomy; thromboembolic phenomenon, incisional problems and other postoperative or anesthesia complications. Patient was told that the likelihood that her condition and symptoms will be treated effectively with this surgical management was very high; the postoperative expectations  were also discussed in detail. The patient also understands the alternative treatment options which were discussed in full. All questions were answered. She was told that she will be contacted by our surgical scheduler regarding the time and date of her surgery; routine preoperative instructions of having nothing to eat or drink after midnight on the day prior to surgery and also coming to the hospital 1.5 hours prior to her time of surgery were also emphasized. Patient scheduled for Hysteroscopy D&C with NovaSure endometrial ablation. Consent signed. Instructions reviewed, including NPO after midnight.   [name], MD [facility]

## 2015-04-30 ENCOUNTER — Encounter: Payer: Self-pay | Admitting: Obstetrics and Gynecology

## 2015-04-30 LAB — SURGICAL PATHOLOGY

## 2015-04-30 NOTE — Anesthesia Postprocedure Evaluation (Signed)
  Anesthesia Post-op Note  Patient: Mariah Brandt  Procedure(s) Performed: Procedure(s): DILATATION & CURETTAGE/HYSTEROSCOPY WITH NOVASURE ABLATION (N/A)  Anesthesia type:General  Patient location: PACU  Post pain: Pain level controlled  Post assessment: Post-op Vital signs reviewed, Patient's Cardiovascular Status Stable, Respiratory Function Stable, Patent Airway and No signs of Nausea or vomiting  Post vital signs: Reviewed and stable  Last Vitals:  Filed Vitals:   04/29/15 1541  BP: 134/90  Pulse: 93  Temp:   Resp:     Level of consciousness: awake, alert  and patient cooperative  Complications: No apparent anesthesia complications

## 2015-05-14 ENCOUNTER — Encounter: Payer: Self-pay | Admitting: Obstetrics and Gynecology

## 2015-05-14 ENCOUNTER — Ambulatory Visit (INDEPENDENT_AMBULATORY_CARE_PROVIDER_SITE_OTHER): Payer: 59 | Admitting: Obstetrics and Gynecology

## 2015-05-14 VITALS — BP 141/89 | HR 78 | Temp 98.8°F | Resp 16 | Ht 61.0 in | Wt 181.2 lb

## 2015-05-14 DIAGNOSIS — Z9889 Other specified postprocedural states: Secondary | ICD-10-CM | POA: Diagnosis not present

## 2015-05-14 DIAGNOSIS — Z30013 Encounter for initial prescription of injectable contraceptive: Secondary | ICD-10-CM

## 2015-05-14 MED ORDER — MEDROXYPROGESTERONE ACETATE 150 MG/ML IM SUSP
150.0000 mg | Freq: Once | INTRAMUSCULAR | Status: AC
  Administered 2015-05-14: 150 mg via INTRAMUSCULAR

## 2015-05-14 NOTE — Progress Notes (Signed)
Subjective:     Mariah Brandt is a 44 y.o. female who presents to the clinic 2 weeks status post Hysteroscopy D&C with Novasure ablation for abnormal uterine bleeding. Eating a regular diet without difficulty. Bowel movements are normal. The patient is not having any pain.  Does report an episode of pain with bleeding lasting for 4 days, approximately 3 days after the surgery, but notes that this has resolved and denies an further pain or bleeding since that time.  Brought Depo Provera for initiation of contraception after ablation.   The following portions of the patient's history were reviewed and updated as appropriate: allergies, current medications, past family history, past medical history, past social history, past surgical history and problem list.  Review of Systems Pertinent items noted in HPI and remainder of comprehensive ROS otherwise negative.    Objective:    BP 141/89 mmHg  Pulse 78  Temp(Src) 98.8 F (37.1 C) (Oral)  Resp 16  Ht 5' 1"  (1.549 m)  Wt 181 lb 3.2 oz (82.192 kg)  BMI 34.26 kg/m2  LMP 05/07/2015 General:  alert, cooperative and no distress  Abdomen: soft, bowel sounds active, non-tender  Incision:   None       Pathology 04/29/2015:  A. ENDOMETRIUM; CURETTAGE:  - FRAGMENTS OF SECRETORY TYPE ENDOMETRIUM AND BENIGN CERVICAL TISSUE.  - NEGATIVE FOR HYPERPLASIA, DYSPLASIA AND MALIGNANCY.   Assessment:    Doing well postoperatively.  Contraception management   Plan:    1. Continue any current medications. 2.  Operative findings again reviewed. Pathology report discussed. 3. Activity restrictions: none 4. Anticipated return to work: now. 5. Depo Provera administered today for contraception management.  Advised that she may experience abnormal spotting or bleeding after initial dose of medication for up to 3 months. Patient notes understanding.  Follow up: 3 months for next Depo Provera injection   Mariah Maid, MD [facility]

## 2015-06-04 ENCOUNTER — Encounter: Payer: Self-pay | Admitting: Physician Assistant

## 2015-06-04 ENCOUNTER — Ambulatory Visit: Payer: Self-pay | Admitting: Physician Assistant

## 2015-06-04 VITALS — BP 140/100 | Temp 98.8°F

## 2015-06-04 DIAGNOSIS — I10 Essential (primary) hypertension: Secondary | ICD-10-CM

## 2015-06-04 DIAGNOSIS — H1032 Unspecified acute conjunctivitis, left eye: Secondary | ICD-10-CM

## 2015-06-04 MED ORDER — TOBRAMYCIN 0.3 % OP SOLN: 2.0000 [drp] | OPHTHALMIC | Status: DC

## 2015-06-04 MED ORDER — LISINOPRIL 10 MG PO TABS: 10.0000 mg | ORAL_TABLET | Freq: Every day | ORAL | Status: DC

## 2015-06-04 NOTE — Patient Instructions (Signed)
Bacterial Conjunctivitis Bacterial conjunctivitis (commonly called pink eye) is redness, soreness, or puffiness (inflammation) of the white part of your eye. It is caused by a germ called bacteria. These germs can easily spread from person to person (contagious). Your eye often will become red or pink. Your eye may also become irritated, watery, or have a thick discharge.  HOME CARE   Apply a cool, clean washcloth over closed eyelids. Do this for 10-20 minutes, 3-4 times a day while you have pain.  Gently wipe away any fluid coming from the eye with a warm, wet washcloth or cotton ball.  Wash your hands often with soap and water. Use paper towels to dry your hands.  Do not share towels or washcloths.  Change or wash your pillowcase every day.  Do not use eye makeup until the infection is gone.  Do not use machines or drive if your vision is blurry.  Stop using contact lenses. Do not use them again until your doctor says it is okay.  Do not touch the tip of the eye drop bottle or medicine tube with your fingers when you put medicine on the eye. GET HELP RIGHT AWAY IF:   Your eye is not better after 3 days of starting your medicine.  You have a yellowish fluid coming out of the eye.  You have more pain in the eye.  Your eye redness is spreading.  Your vision becomes blurry.  You have a fever or lasting symptoms for more than 2-3 days.  You have a fever and your symptoms suddenly get worse.  You have pain in the face.  Your face gets red or puffy (swollen). MAKE SURE YOU:   Understand these instructions.  Will watch this condition.  Will get help right away if you are not doing well or get worse.   This information is not intended to replace advice given to you by your health care provider. Make sure you discuss any questions you have with your health care provider.   Document Released: 05/05/2008 Document Revised: 07/13/2012 Document Reviewed: 04/01/2012 Elsevier  Interactive Patient Education 2016 [employer]. Hypertension Hypertension is another name for high blood pressure. High blood pressure forces your heart to work harder to pump blood. A blood pressure reading has two numbers, which includes a higher number over a lower number (example: 110/72). HOME CARE   Have your blood pressure rechecked by your doctor.  Only take medicine as told by your doctor. Follow the directions carefully. The medicine does not work as well if you skip doses. Skipping doses also puts you at risk for problems.  Do not smoke.  Monitor your blood pressure at home as told by your doctor. GET HELP IF:  You think you are having a reaction to the medicine you are taking.  You have repeat headaches or feel dizzy.  You have puffiness (swelling) in your ankles.  You have trouble with your vision. GET HELP RIGHT AWAY IF:   You get a very bad headache and are confused.  You feel weak, numb, or faint.  You get chest or belly (abdominal) pain.  You throw up (vomit).  You cannot breathe very well. MAKE SURE YOU:   Understand these instructions.  Will watch your condition.  Will get help right away if you are not doing well or get worse.   This information is not intended to replace advice given to you by your health care provider. Make sure you discuss any questions you have  with your health care provider.   Document Released: 01/13/2008 Document Revised: 08/01/2013 Document Reviewed: 05/19/2013 Elsevier Interactive Patient Education 2016 [place] Your High Blood Pressure Blood pressure is a measurement of how forceful your blood is pressing against the walls of the arteries. Arteries are muscular tubes within the circulatory system. Blood pressure does not stay the same. Blood pressure rises when you are active, excited, or nervous; and it lowers during sleep and relaxation. If the numbers measuring your blood pressure stay above normal most of  the time, you are at risk for health problems. High blood pressure (hypertension) is a long-term (chronic) condition in which blood pressure is elevated. A blood pressure reading is recorded as two numbers, such as 120 over 80 (or 120/80). The first, higher number is called the systolic pressure. It is a measure of the pressure in your arteries as the heart beats. The second, lower number is called the diastolic pressure. It is a measure of the pressure in your arteries as the heart relaxes between beats.  Keeping your blood pressure in a normal range is important to your overall health and prevention of health problems, such as heart disease and stroke. When your blood pressure is uncontrolled, your heart has to work harder than normal. High blood pressure is a very common condition in adults because blood pressure tends to rise with age. Men and women are equally likely to have hypertension but at different times in life. Before age 65, men are more likely to have hypertension. After 42 years of age, women are more likely to have it. Hypertension is especially common in African Americans. This condition often has no signs or symptoms. The cause of the condition is usually not known. Your caregiver can help you come up with a plan to keep your blood pressure in a normal, healthy range. BLOOD PRESSURE STAGES Blood pressure is classified into four stages: normal, prehypertension, stage 1, and stage 2. Your blood pressure reading will be used to determine what type of treatment, if any, is necessary. Appropriate treatment options are tied to these four stages:  Normal  Systolic pressure (mm Hg): below 120.  Diastolic pressure (mm Hg): below 80. Prehypertension  Systolic pressure (mm Hg): 120 to 139.  Diastolic pressure (mm Hg): 80 to 89. Stage1  Systolic pressure (mm Hg): 140 to 159.  Diastolic pressure (mm Hg): 90 to 99. Stage2  Systolic pressure (mm Hg): 160 or above.  Diastolic pressure  (mm Hg): 100 or above. RISKS RELATED TO HIGH BLOOD PRESSURE Managing your blood pressure is an important responsibility. Uncontrolled high blood pressure can lead to:  A heart attack.  A stroke.  A weakened blood vessel (aneurysm).  Heart failure.  Kidney damage.  Eye damage.  Metabolic syndrome.  Memory and concentration problems. HOW TO MANAGE YOUR BLOOD PRESSURE Blood pressure can be managed effectively with lifestyle changes and medicines (if needed). Your caregiver will help you come up with a plan to bring your blood pressure within a normal range. Your plan should include the following: Education  Read all information provided by your caregivers about how to control blood pressure.  Educate yourself on the latest guidelines and treatment recommendations. New research is always being done to further define the risks and treatments for high blood pressure. Lifestylechanges  Control your weight.  Avoid smoking.  Stay physically active.  Reduce the amount of salt in your diet.  Reduce stress.  Control any chronic conditions, such as high cholesterol or  diabetes.  Reduce your alcohol intake. Medicines  Several medicines (antihypertensive medicines) are available, if needed, to bring blood pressure within a normal range. Communication  Review all the medicines you take with your caregiver because there may be side effects or interactions.  Talk with your caregiver about your diet, exercise habits, and other lifestyle factors that may be contributing to high blood pressure.  See your caregiver regularly. Your caregiver can help you create and adjust your plan for managing high blood pressure. RECOMMENDATIONS FOR TREATMENT AND FOLLOW-UP  The following recommendations are based on current guidelines for managing high blood pressure in nonpregnant adults. Use these recommendations to identify the proper follow-up period or treatment option based on your blood  pressure reading. You can discuss these options with your caregiver.  Systolic pressure of 423 to 536 or diastolic pressure of 80 to 89: Follow up with your caregiver as directed.  Systolic pressure of 144 to 315 or diastolic pressure of 90 to 100: Follow up with your caregiver within 2 months.  Systolic pressure above 400 or diastolic pressure above 867: Follow up with your caregiver within 1 month.  Systolic pressure above 619 or diastolic pressure above 509: Consider antihypertensive therapy; follow up with your caregiver within 1 week.  Systolic pressure above 326 or diastolic pressure above 712: Begin antihypertensive therapy; follow up with your caregiver within 1 week.   This information is not intended to replace advice given to you by your health care provider. Make sure you discuss any questions you have with your health care provider.   Document Released: 04/20/2012 Document Reviewed: 04/20/2012 Elsevier Interactive Patient Education [employer].

## 2015-06-04 NOTE — Progress Notes (Signed)
S:  C/o left eye being irritated, matted, sx started 2 weeks ago but last pm drainage got worse and has been draining today,  denies, cough, congestion, fever, chills, v/d; remainder ros neg; also bp has been running high, was high in OR and had to stay longer due to elevated bp, [facility] around 140/100 , + fam hx of htn, dm  O: vitals w elevated bp 140/100;  nad, perrl eomi, left eye with injected conjunctiva, a little drainage at this time, tms clear, nasal mucosa inflamed, throat wnl, neck supple no lymph, lungs c t a, cv rrr  A:  Acute conjunctivitis, essential htn  P: tobramycin opth gtts, return if not better in 3 - 5d, if worsening return earlier or see eye doctor, no work until eye is no longer red or draining, lisinopril 107m qd, recheck bp in 1 week, f/u with pcp for yearly physical

## 2015-06-07 ENCOUNTER — Encounter: Payer: Self-pay | Admitting: Internal Medicine

## 2015-06-07 ENCOUNTER — Ambulatory Visit (INDEPENDENT_AMBULATORY_CARE_PROVIDER_SITE_OTHER): Payer: 59 | Admitting: Internal Medicine

## 2015-06-07 VITALS — BP 160/100 | HR 101 | Temp 99.2°F | Resp 12 | Ht 61.0 in | Wt 180.0 lb

## 2015-06-07 DIAGNOSIS — I10 Essential (primary) hypertension: Secondary | ICD-10-CM

## 2015-06-07 DIAGNOSIS — R7303 Prediabetes: Secondary | ICD-10-CM

## 2015-06-07 DIAGNOSIS — E669 Obesity, unspecified: Secondary | ICD-10-CM | POA: Diagnosis not present

## 2015-06-07 MED ORDER — HYDROCHLOROTHIAZIDE 25 MG PO TABS: 25.0000 mg | ORAL_TABLET | Freq: Every day | ORAL | Status: DC

## 2015-06-07 NOTE — Progress Notes (Signed)
Pre visit review using our clinic review tool, if applicable. No additional management support is needed unless otherwise documented below in the visit note. 

## 2015-06-07 NOTE — Patient Instructions (Addendum)
Start hctz in the morning.  Exercise 30 minutes 3 times week   Return in 10 days for fasting labs and RN visit for BP check   Make sure you take your HCTZ that morning

## 2015-06-07 NOTE — Progress Notes (Signed)
Subjective:  Patient ID: Mariah Brandt, female    DOB: [birth year]  Age: 44 y.o. MRN: [id number]  CC: The primary encounter diagnosis was Essential hypertension. Diagnoses of Prediabetes and Obesity (BMI 30-39.9) were also pertinent to this visit.  HPI Mariah Brandt presents for follow up on hypertension and obesity.  She was prescribed lisinopril by another provider but has not started the medication yet.  She has no history of chest pain,  Shortness of breath,  fluid retention, or diabetes.     Outpatient Prescriptions Prior to Visit  Medication Sig Dispense Refill  . HYDROcodone-acetaminophen (NORCO) 5-325 MG per tablet Take 1 tablet by mouth every 6 (six) hours as needed for moderate pain. 30 tablet 0  . ibuprofen (ADVIL,MOTRIN) 800 MG tablet Take 1 tablet (800 mg total) by mouth every 8 (eight) hours as needed. 60 tablet 1  . Lactulose 20 GM/30ML SOLN Take 30 mLs (20 g total) by mouth every 6 (six) hours as needed. Until constipation is relieved 236 mL 3  . medroxyPROGESTERone (DEPO-PROVERA) 150 MG/ML injection Inject 1 mL (150 mg total) into the muscle every 3 (three) months. 1 mL 4  . polyethylene glycol powder (GLYCOLAX/MIRALAX) powder Take 17 g by mouth daily. 3350 g 1  . tobramycin (TOBREX) 0.3 % ophthalmic solution Place 2 drops into the left eye every 4 (four) hours. 5 mL 0  . lisinopril (PRINIVIL,ZESTRIL) 10 MG tablet Take 1 tablet (10 mg total) by mouth daily. (Patient not taking: Reported on 06/07/2015) 90 tablet 1   No facility-administered medications prior to visit.    Review of Systems;  Patient denies headache, fevers, malaise, unintentional weight loss, skin rash, eye pain, sinus congestion and sinus pain, sore throat, dysphagia,  hemoptysis , cough, dyspnea, wheezing, chest pain, palpitations, orthopnea, edema, abdominal pain, nausea, melena, diarrhea, constipation, flank pain, dysuria, hematuria, urinary  Frequency, nocturia, numbness, tingling,  seizures,  Focal weakness, Loss of consciousness,  Tremor, insomnia, depression, anxiety, and suicidal ideation.      Objective:  BP 160/100 mmHg  Pulse 101  Temp(Src) 99.2 F (37.3 C) (Oral)  Resp 12  Ht 5' 1"  (1.549 m)  Wt 180 lb (81.647 kg)  BMI 34.03 kg/m2  SpO2 99%  LMP 05/07/2015  BP Readings from Last 3 Encounters:  06/07/15 160/100  06/04/15 140/100  05/14/15 141/89    Wt Readings from Last 3 Encounters:  06/07/15 180 lb (81.647 kg)  05/14/15 181 lb 3.2 oz (82.192 kg)  04/29/15 176 lb (79.833 kg)    General appearance: alert, cooperative and appears stated age Ears: normal TM's and external ear canals both ears Throat: lips, mucosa, and tongue normal; teeth and gums normal Neck: no adenopathy, no carotid bruit, supple, symmetrical, trachea midline and thyroid not enlarged, symmetric, no tenderness/mass/nodules Back: symmetric, no curvature. ROM normal. No CVA tenderness. Lungs: clear to auscultation bilaterally Heart: regular rate and rhythm, S1, S2 normal, no murmur, click, rub or gallop Abdomen: soft, non-tender; bowel sounds normal; no masses,  no organomegaly Pulses: 2+ and symmetric Skin: Skin color, texture, turgor normal. No rashes or lesions Lymph nodes: Cervical, supraclavicular, and axillary nodes normal.  Lab Results  Component Value Date   HGBA1C 5.9 12/20/2013   HGBA1C 5.7 06/26/2013   HGBA1C 6.3 12/01/2011    Lab Results  Component Value Date   CREATININE 0.7 07/25/2013   CREATININE 0.66 06/26/2013   CREATININE 0.68 12/01/2011    Lab Results  Component Value Date   WBC 5.2 04/11/2015  HGB 14.2 04/11/2015   HCT 43.0 04/11/2015   PLT 274 04/11/2015   GLUCOSE 95 06/26/2013   CHOL 156 07/25/2013   TRIG 80 07/25/2013   HDL 44 07/25/2013   LDLCALC 96 07/25/2013   ALT 17 07/25/2013   AST 12* 07/25/2013   NA 140 07/25/2013   K 3.3* 07/25/2013   CL 108* 06/26/2013   CREATININE 0.7 07/25/2013   BUN 26* 07/25/2013   CO2 26 06/26/2013    TSH 1.410 03/12/2015   HGBA1C 5.9 12/20/2013   MICROALBUR 1.8 12/20/2013    No results found.  Assessment & Plan:   Problem List Items Addressed This Visit    Obesity (BMI 30-39.9)    I have addressed  BMI and recommended a low glycemic index diet utilizing smaller more frequent meals to increase metabolism.  I have also recommended that patient start exercising with a goal of 30 minutes of aerobic exercise a minimum of 5 days per week. Screening for lipid disorders, thyroid and diabetes to be done today.        Essential hypertension - Primary    Advised to start medication for BP but changed to hctz.  .  Will return for labs and BP check in 2 weeks.       Relevant Medications   hydrochlorothiazide (HYDRODIURIL) 25 MG tablet   Other Relevant Orders   Comprehensive metabolic panel    Other Visit Diagnoses    Prediabetes        Relevant Orders    Hemoglobin A1c    LDL cholesterol, direct    Lipid panel    Microalbumin / creatinine urine ratio       A total of 25 minutes of face to face time was spent with patient more than half of which was spent in counselling about the above mentioned conditions  and coordination of care  I am having Ms. Robison-Hendrix start on hydrochlorothiazide. I am also having her maintain her Lactulose, polyethylene glycol powder, HYDROcodone-acetaminophen, ibuprofen, medroxyPROGESTERone, tobramycin, and lisinopril.  Meds ordered this encounter  Medications  . hydrochlorothiazide (HYDRODIURIL) 25 MG tablet    Sig: Take 1 tablet (25 mg total) by mouth daily.    Dispense:  90 tablet    Refill:  3    There are no discontinued medications.  Follow-up: No Follow-up on file.   Crecencio Mc, MD

## 2015-06-09 DIAGNOSIS — I10 Essential (primary) hypertension: Secondary | ICD-10-CM

## 2015-06-09 HISTORY — DX: Essential (primary) hypertension: I10

## 2015-06-09 NOTE — Assessment & Plan Note (Signed)
I have addressed  BMI and recommended a low glycemic index diet utilizing smaller more frequent meals to increase metabolism.  I have also recommended that patient start exercising with a goal of 30 minutes of aerobic exercise a minimum of 5 days per week. Screening for lipid disorders, thyroid and diabetes to be done today.   

## 2015-06-09 NOTE — Assessment & Plan Note (Signed)
Advised to start medication for BP but changed to hctz.  .  Will return for labs and BP check in 2 weeks.

## 2015-06-20 ENCOUNTER — Other Ambulatory Visit (INDEPENDENT_AMBULATORY_CARE_PROVIDER_SITE_OTHER): Payer: 59

## 2015-06-20 ENCOUNTER — Ambulatory Visit (INDEPENDENT_AMBULATORY_CARE_PROVIDER_SITE_OTHER): Payer: 59

## 2015-06-20 VITALS — BP 118/78 | HR 103 | Resp 20 | Wt 176.0 lb

## 2015-06-20 DIAGNOSIS — R030 Elevated blood-pressure reading, without diagnosis of hypertension: Secondary | ICD-10-CM | POA: Diagnosis not present

## 2015-06-20 DIAGNOSIS — R7303 Prediabetes: Secondary | ICD-10-CM

## 2015-06-20 DIAGNOSIS — IMO0001 Reserved for inherently not codable concepts without codable children: Secondary | ICD-10-CM

## 2015-06-20 DIAGNOSIS — I10 Essential (primary) hypertension: Secondary | ICD-10-CM

## 2015-06-20 LAB — COMPREHENSIVE METABOLIC PANEL
ALBUMIN: 4.2 g/dL (ref 3.5–5.2)
ALT: 16 U/L (ref 0–35)
AST: 17 U/L (ref 0–37)
Alkaline Phosphatase: 133 U/L — ABNORMAL HIGH (ref 39–117)
BUN: 9 mg/dL (ref 6–23)
CHLORIDE: 104 meq/L (ref 96–112)
CO2: 27 mEq/L (ref 19–32)
CREATININE: 0.73 mg/dL (ref 0.40–1.20)
Calcium: 9.5 mg/dL (ref 8.4–10.5)
GFR: 112.19 mL/min (ref >60.00)
GLUCOSE: 130 mg/dL — AB (ref 70–99)
Potassium: 3 mEq/L — ABNORMAL LOW (ref 3.5–5.1)
SODIUM: 141 meq/L (ref 135–145)
Total Bilirubin: 0.5 mg/dL (ref 0.2–1.2)
Total Protein: 7.5 g/dL (ref 6.0–8.3)

## 2015-06-20 LAB — LIPID PANEL
CHOL/HDL RATIO: 4
Cholesterol: 182 mg/dL (ref 0–200)
HDL: 44 mg/dL (ref >39.00)
LDL CALC: 117 mg/dL — AB (ref 0–99)
NONHDL: 137.56
Triglycerides: 105 mg/dL (ref 0.0–149.0)
VLDL: 21 mg/dL (ref 0.0–40.0)

## 2015-06-20 LAB — HEMOGLOBIN A1C: HEMOGLOBIN A1C: 6.7 % — AB (ref 4.6–6.5)

## 2015-06-20 LAB — MICROALBUMIN / CREATININE URINE RATIO
Creatinine,U: 211.9 mg/dL
MICROALB/CREAT RATIO: 0.8 mg/g (ref 0.0–30.0)
Microalb, Ur: 1.6 mg/dL (ref 0.0–1.9)

## 2015-06-20 LAB — LDL CHOLESTEROL, DIRECT: Direct LDL: 120 mg/dL

## 2015-06-20 NOTE — Progress Notes (Signed)
  I have reviewed the above information and agree with above.   Patient to continue HCTZ . BP is at goal.   Deborra Medina, MD

## 2015-06-20 NOTE — Progress Notes (Signed)
Patient came in for BP check, 2 weeks post visit. Started HCTZ 2 weeks ago.  Per patient she takes her BP in the morning (average ranges are 597-416'L systolically over 84'T) and then takes the medicine.  By mid evening her BP has increased to 130-140's over 80-90's.  I checked BP in bilateral upper extremities.  See vitals for details.  Patient requested a weigh check today.  She is down 4 lbs since last visit ( now weighs 176lbs). She was thrilled!!  Please advise?

## 2015-06-21 ENCOUNTER — Other Ambulatory Visit: Payer: Self-pay | Admitting: Internal Medicine

## 2015-06-21 ENCOUNTER — Encounter: Payer: Self-pay | Admitting: Internal Medicine

## 2015-06-21 DIAGNOSIS — E876 Hypokalemia: Secondary | ICD-10-CM

## 2015-06-21 MED ORDER — POTASSIUM CHLORIDE CRYS ER 20 MEQ PO TBCR: 20.0000 meq | EXTENDED_RELEASE_TABLET | Freq: Every day | ORAL | Status: DC

## 2015-06-21 NOTE — Progress Notes (Signed)
Sent mychart message

## 2015-07-30 ENCOUNTER — Ambulatory Visit (INDEPENDENT_AMBULATORY_CARE_PROVIDER_SITE_OTHER): Payer: 59 | Admitting: Obstetrics and Gynecology

## 2015-07-30 VITALS — BP 145/89 | HR 84 | Wt 178.6 lb

## 2015-07-30 DIAGNOSIS — N921 Excessive and frequent menstruation with irregular cycle: Secondary | ICD-10-CM

## 2015-07-30 MED ORDER — MEDROXYPROGESTERONE ACETATE 150 MG/ML IM SUSP
150.0000 mg | Freq: Once | INTRAMUSCULAR | Status: AC
  Administered 2015-07-30: 150 mg via INTRAMUSCULAR

## 2015-07-30 NOTE — Progress Notes (Signed)
Pt is here for her depo provera She states she is bleeding like she is having a period, states she just had ablation not to long ago, advised pt to follow up with Dr. Marcelline Mates

## 2015-08-20 ENCOUNTER — Encounter: Payer: Self-pay | Admitting: Internal Medicine

## 2015-08-22 ENCOUNTER — Ambulatory Visit: Payer: Self-pay | Admitting: Internal Medicine

## 2015-10-11 ENCOUNTER — Ambulatory Visit: Payer: Self-pay | Admitting: Internal Medicine

## 2015-10-15 ENCOUNTER — Ambulatory Visit (INDEPENDENT_AMBULATORY_CARE_PROVIDER_SITE_OTHER): Payer: 59 | Admitting: Obstetrics and Gynecology

## 2015-10-15 VITALS — BP 106/75 | HR 85 | Wt 173.5 lb

## 2015-10-15 DIAGNOSIS — Z3040 Encounter for surveillance of contraceptives, unspecified: Secondary | ICD-10-CM

## 2015-10-15 MED ORDER — MEDROXYPROGESTERONE ACETATE 150 MG/ML IM SUSP
150.0000 mg | Freq: Once | INTRAMUSCULAR | Status: AC
  Administered 2015-10-15: 150 mg via INTRAMUSCULAR

## 2015-10-15 NOTE — Progress Notes (Signed)
Patient ID: Mariah Brandt, female   DOB: Nov 21, 1970, 45 y.o.   MRN: [id number] Pt presents for Depo-Provera injection. No c/o side effects.

## 2015-10-21 ENCOUNTER — Ambulatory Visit (INDEPENDENT_AMBULATORY_CARE_PROVIDER_SITE_OTHER): Payer: 59 | Admitting: Internal Medicine

## 2015-10-21 ENCOUNTER — Encounter: Payer: Self-pay | Admitting: Internal Medicine

## 2015-10-21 VITALS — BP 122/86 | HR 89 | Temp 98.4°F | Resp 12 | Ht 61.0 in | Wt 176.5 lb

## 2015-10-21 DIAGNOSIS — E876 Hypokalemia: Secondary | ICD-10-CM

## 2015-10-21 DIAGNOSIS — E785 Hyperlipidemia, unspecified: Secondary | ICD-10-CM | POA: Diagnosis not present

## 2015-10-21 DIAGNOSIS — I10 Essential (primary) hypertension: Secondary | ICD-10-CM | POA: Diagnosis not present

## 2015-10-21 DIAGNOSIS — E119 Type 2 diabetes mellitus without complications: Secondary | ICD-10-CM | POA: Diagnosis not present

## 2015-10-21 NOTE — Progress Notes (Signed)
Pre-visit discussion using our clinic review tool. No additional management support is needed unless otherwise documented below in the visit note.  

## 2015-10-21 NOTE — Progress Notes (Signed)
Subjective:  Patient ID: Mariah Brandt, female    DOB: 09-21-1970  Age: 45 y.o. MRN: [id number]  CC: The primary encounter diagnosis was Diabetes mellitus without complication ([place]). Diagnoses of Hypokalemia, Hyperlipidemia, and Essential hypertension were also pertinent to this visit.  HPI Mariah Brandt presents for follow up on diabetes and hypertension . Last seen in October   She Does not check her blood sugars.  She had infrequent episodes of suspected hypglycemia  when she skips a meal .  She misses dinner once or twice weekly because she is often not hungry when she gets home from work and goes to bed by 9 am. She skips breakfast often as well.  She is not exercising regularly, but has set up her treadmill in her son's now vacant bedroom.   Eye exam Feb 2016,  Sees Levert Feinstein  Last potassium supplement was taken 2 months ago,  Hard to take   Wants to continue taking a diuretic    Lab Results  Component Value Date   HGBA1C 7.5* 10/21/2015     Outpatient Prescriptions Prior to Visit  Medication Sig Dispense Refill  . Lactulose 20 GM/30ML SOLN Take 30 mLs (20 g total) by mouth every 6 (six) hours as needed. Until constipation is relieved 236 mL 3  . medroxyPROGESTERone (DEPO-PROVERA) 150 MG/ML injection Inject 1 mL (150 mg total) into the muscle every 3 (three) months. 1 mL 4  . hydrochlorothiazide (HYDRODIURIL) 25 MG tablet Take 1 tablet (25 mg total) by mouth daily. 90 tablet 3  . HYDROcodone-acetaminophen (NORCO) 5-325 MG per tablet Take 1 tablet by mouth every 6 (six) hours as needed for moderate pain. (Patient not taking: Reported on 07/30/2015) 30 tablet 0  . ibuprofen (ADVIL,MOTRIN) 800 MG tablet Take 1 tablet (800 mg total) by mouth every 8 (eight) hours as needed. (Patient not taking: Reported on 07/30/2015) 60 tablet 1  . lisinopril (PRINIVIL,ZESTRIL) 10 MG tablet Take 1 tablet (10 mg total) by mouth daily. (Patient not taking: Reported on  06/07/2015) 90 tablet 1  . polyethylene glycol powder (GLYCOLAX/MIRALAX) powder Take 17 g by mouth daily. (Patient not taking: Reported on 10/21/2015) 3350 g 1  . potassium chloride SA (K-DUR,KLOR-CON) 20 MEQ tablet Take 1 tablet (20 mEq total) by mouth daily. (Patient not taking: Reported on 10/21/2015) 30 tablet 3  . tobramycin (TOBREX) 0.3 % ophthalmic solution Place 2 drops into the left eye every 4 (four) hours. (Patient not taking: Reported on 07/30/2015) 5 mL 0   No facility-administered medications prior to visit.    Review of Systems;  Patient denies headache, fevers, malaise, unintentional weight loss, skin rash, eye pain, sinus congestion and sinus pain, sore throat, dysphagia,  hemoptysis , cough, dyspnea, wheezing, chest pain, palpitations, orthopnea, edema, abdominal pain, nausea, melena, diarrhea, constipation, flank pain, dysuria, hematuria, urinary  Frequency, nocturia, numbness, tingling, seizures,  Focal weakness, Loss of consciousness,  Tremor, insomnia, depression, anxiety, and suicidal ideation.      Objective:  BP 122/86 mmHg  Pulse 89  Temp(Src) 98.4 F (36.9 C) (Oral)  Resp 12  Ht 5' 1"  (1.549 m)  Wt 176 lb 8 oz (80.06 kg)  BMI 33.37 kg/m2  SpO2 98%  BP Readings from Last 3 Encounters:  10/21/15 122/86  10/15/15 106/75  07/30/15 145/89    Wt Readings from Last 3 Encounters:  10/21/15 176 lb 8 oz (80.06 kg)  10/15/15 173 lb 8 oz (78.699 kg)  07/30/15 178 lb 9.6 oz (81.012 kg)  General appearance: alert, cooperative and appears stated age Ears: normal TM's and external ear canals both ears Throat: lips, mucosa, and tongue normal; teeth and gums normal Neck: no adenopathy, no carotid bruit, supple, symmetrical, trachea midline and thyroid not enlarged, symmetric, no tenderness/mass/nodules Back: symmetric, no curvature. ROM normal. No CVA tenderness. Lungs: clear to auscultation bilaterally Heart: regular rate and rhythm, S1, S2 normal, no murmur,  click, rub or gallop Abdomen: soft, non-tender; bowel sounds normal; no masses,  no organomegaly Pulses: 2+ and symmetric Skin: Skin color, texture, turgor normal. No rashes or lesions Lymph nodes: Cervical, supraclavicular, and axillary nodes normal.  Lab Results  Component Value Date   HGBA1C 7.5* 10/21/2015   HGBA1C 6.7* 06/20/2015   HGBA1C 5.9 12/20/2013    Lab Results  Component Value Date   CREATININE 0.97 10/21/2015   CREATININE 0.73 06/20/2015   CREATININE 0.7 07/25/2013    Lab Results  Component Value Date   WBC 5.2 04/11/2015   HGB 14.2 04/11/2015   HCT 43.0 04/11/2015   PLT 274 04/11/2015   GLUCOSE 149* 10/21/2015   CHOL 182 06/20/2015   TRIG 105.0 06/20/2015   HDL 44.00 06/20/2015   LDLDIRECT 129.0 10/21/2015   LDLCALC 117* 06/20/2015   ALT 13 10/21/2015   AST 15 10/21/2015   NA 139 10/21/2015   K 3.1* 10/21/2015   CL 102 10/21/2015   CREATININE 0.97 10/21/2015   BUN 12 10/21/2015   CO2 26 10/21/2015   TSH 1.410 03/12/2015   HGBA1C 7.5* 10/21/2015   MICROALBUR 1.2 10/21/2015    No results found.  Assessment & Plan:   Problem List Items Addressed This Visit    Diabetes mellitus without complication ([place]) - Primary    Historically Well-controlled on diet alone .  hemoglobin A1c has risen from  < 6.0 to 7.5 Medication will be started . Patient is up-to-date on eye exams and foot exam was done today.  There is  no proteinuria on current  micro urinalysis .  Fasting lipids have been reviewed and statin therapy has been advised and updated according to new ACC guidelines based on patient's 10 year risk of CAD annual eye exam recommended.    Low GI diet and Victoza recommended for diabetes management and weight loss.     Lab Results  Component Value Date   HGBA1C 7.5* 10/21/2015   Lab Results  Component Value Date   MICROALBUR 1.2 10/21/2015   Lab Results  Component Value Date   CHOL 182 06/20/2015   HDL 44.00 06/20/2015   LDLCALC 117*  06/20/2015   LDLDIRECT 129.0 10/21/2015   TRIG 105.0 06/20/2015   CHOLHDL 4 06/20/2015             Relevant Orders   Comprehensive metabolic panel (Completed)   Hemoglobin A1c (Completed)   Microalbumin / creatinine urine ratio (Completed)   Essential hypertension    Currently taking hctz only.  History of  Recurrent hypokalemia  requiring supplementation. Will change medication to potassium sparing   Lab Results  Component Value Date   CREATININE 0.97 10/21/2015   Lab Results  Component Value Date   NA 139 10/21/2015   K 3.1* 10/21/2015   CL 102 10/21/2015   CO2 26 10/21/2015         Hypokalemia    Other Visit Diagnoses    Hyperlipidemia        Relevant Orders    LDL cholesterol, direct (Completed)       I have discontinued  Ms. Spayd-Hendrix's polyethylene glycol powder, HYDROcodone-acetaminophen, ibuprofen, tobramycin, lisinopril, hydrochlorothiazide, and potassium chloride SA. I am also having her maintain her Lactulose and medroxyPROGESTERone.  No orders of the defined types were placed in this encounter.    Medications Discontinued During This Encounter  Medication Reason  . HYDROcodone-acetaminophen (NORCO) 5-325 MG per tablet Completed Course  . ibuprofen (ADVIL,MOTRIN) 800 MG tablet Completed Course  . lisinopril (PRINIVIL,ZESTRIL) 10 MG tablet Change in therapy  . polyethylene glycol powder (GLYCOLAX/MIRALAX) powder Completed Course  . tobramycin (TOBREX) 0.3 % ophthalmic solution Completed Course  . HYDROcodone-acetaminophen (NORCO) 5-325 MG per tablet Completed Course  . potassium chloride SA (K-DUR,KLOR-CON) 20 MEQ tablet   . hydrochlorothiazide (HYDRODIURIL) 25 MG tablet     Follow-up: No Follow-up on file.   Crecencio Mc, MD

## 2015-10-21 NOTE — Patient Instructions (Signed)
I want you to commit to 30 minutes of walking daily on your treadmill  Do not skip breakfast, lunch or dinner.  This can cause your sugar to drop even if you don't take medications for daibetes, because you DO have diabetes  Try the Premier Protein chocolate and vanilla shakes  30 g protein 160 cal  1 net carb  ([employer] and [employer])   Diabetes and Standards of Medical Care Diabetes is complicated. You may find that your diabetes team includes a dietitian, nurse, diabetes educator, eye doctor, and more. To help everyone know what is going on and to help you get the care you deserve, the following schedule of care was developed to help keep you on track. Below are the tests, exams, vaccines, medicines, education, and plans you will need. HbA1c test This test shows how well you have controlled your glucose over the past 2-3 months. It is used to see if your diabetes management plan needs to be adjusted.   It is performed at least 2 times a year if you are meeting treatment goals.  It is performed 4 times a year if therapy has changed or if you are not meeting treatment goals. Blood pressure test  This test is performed at every routine medical visit. The goal is less than 140/90 mm Hg for most people, but 130/80 mm Hg in some cases. Ask your health care provider about your goal. Dental exam  Follow up with the dentist regularly. Eye exam  If you are diagnosed with type 1 diabetes as a child, get an exam upon reaching the age of 75 years or older and having had diabetes for 3-5 years. Yearly eye exams are recommended after that initial eye exam.  If you are diagnosed with type 1 diabetes as an adult, get an exam within 5 years of diagnosis and then yearly.  If you are diagnosed with type 2 diabetes, get an exam as soon as possible after the diagnosis and then yearly. Foot care exam  Visual foot exams are performed at every routine medical visit. The exams check for cuts, injuries, or other  problems with the feet.  You should have a complete foot exam performed every year. This exam includes an inspection of the structure and skin of your feet, a check of the pulses in your feet, and a check of the sensation in your feet.  Type 1 diabetes: The first exam is performed 5 years after diagnosis.  Type 2 diabetes: The first exam is performed at the time of diagnosis.  Check your feet nightly for cuts, injuries, or other problems with your feet. Tell your health care provider if anything is not healing. Kidney function test (urine microalbumin)  This test is performed once a year.  Type 1 diabetes: The first test is performed 5 years after diagnosis.  Type 2 diabetes: The first test is performed at the time of diagnosis.  A serum creatinine and estimated glomerular filtration rate (eGFR) test is done once a year to assess the level of chronic kidney disease (CKD), if present. Lipid profile (cholesterol, HDL, LDL, triglycerides)  Performed every 5 years for most people.  The goal for LDL is less than 100 mg/dL. If you are at high risk, the goal is less than 70 mg/dL.  The goal for HDL is 40 mg/dL-50 mg/dL for men and 50 mg/dL-60 mg/dL for women. An HDL cholesterol of 60 mg/dL or higher gives some protection against heart disease.  The goal for  triglycerides is less than 150 mg/dL. Immunizations  The flu (influenza) vaccine is recommended yearly for every person 76 months of age or older who has diabetes.  The pneumonia (pneumococcal) vaccine is recommended for every person 56 years of age or older who has diabetes. Adults 70 years of age or older may receive the pneumonia vaccine as a series of two separate shots.  The hepatitis B vaccine is recommended for adults shortly after they have been diagnosed with diabetes.  The Tdap (tetanus, diphtheria, and pertussis) vaccine should be given:  According to normal childhood vaccination schedules, for children.  Every 10 years,  for adults who have diabetes. Diabetes self-management education  Education is recommended at diagnosis and ongoing as needed. Treatment plan  Your treatment plan is reviewed at every medical visit.   This information is not intended to replace advice given to you by your health care provider. Make sure you discuss any questions you have with your health care provider.   Document Released: 05/24/2009 Document Revised: 08/17/2014 Document Reviewed: 12/27/2012 Elsevier Interactive Patient Education [employer].

## 2015-10-22 LAB — COMPREHENSIVE METABOLIC PANEL
ALBUMIN: 4.2 g/dL (ref 3.5–5.2)
ALK PHOS: 101 U/L (ref 39–117)
ALT: 13 U/L (ref 0–35)
AST: 15 U/L (ref 0–37)
BUN: 12 mg/dL (ref 6–23)
CALCIUM: 9.7 mg/dL (ref 8.4–10.5)
CO2: 26 meq/L (ref 19–32)
CREATININE: 0.97 mg/dL (ref 0.40–1.20)
Chloride: 102 mEq/L (ref 96–112)
GFR: 80.69 mL/min (ref >60.00)
Glucose, Bld: 149 mg/dL — ABNORMAL HIGH (ref 70–99)
Potassium: 3.1 mEq/L — ABNORMAL LOW (ref 3.5–5.1)
Sodium: 139 mEq/L (ref 135–145)
Total Bilirubin: 0.3 mg/dL (ref 0.2–1.2)
Total Protein: 7.6 g/dL (ref 6.0–8.3)

## 2015-10-22 LAB — MICROALBUMIN / CREATININE URINE RATIO
Creatinine,U: 188.9 mg/dL
MICROALB UR: 1.2 mg/dL (ref 0.0–1.9)
Microalb Creat Ratio: 0.6 mg/g (ref 0.0–30.0)

## 2015-10-22 LAB — LDL CHOLESTEROL, DIRECT: Direct LDL: 129 mg/dL

## 2015-10-22 LAB — HEMOGLOBIN A1C: HEMOGLOBIN A1C: 7.5 % — AB (ref 4.6–6.5)

## 2015-10-23 ENCOUNTER — Encounter: Payer: Self-pay | Admitting: Internal Medicine

## 2015-10-23 NOTE — Assessment & Plan Note (Signed)
Currently taking hctz only.  History of  Recurrent hypokalemia  requiring supplementation. Will change medication to potassium sparing   Lab Results  Component Value Date   CREATININE 0.97 10/21/2015   Lab Results  Component Value Date   NA 139 10/21/2015   K 3.1* 10/21/2015   CL 102 10/21/2015   CO2 26 10/21/2015

## 2015-10-23 NOTE — Assessment & Plan Note (Addendum)
Historically Well-controlled on diet alone .  hemoglobin A1c has risen from  < 6.0 to 7.5 Medication will be started . Patient is up-to-date on eye exams and foot exam was done today.  There is  no proteinuria on current  micro urinalysis .  Fasting lipids have been reviewed and statin therapy has been advised and updated according to new ACC guidelines based on patient's 10 year risk of CAD annual eye exam recommended.    Low GI diet and Victoza recommended for diabetes management and weight loss.     Lab Results  Component Value Date   HGBA1C 7.5* 10/21/2015   Lab Results  Component Value Date   MICROALBUR 1.2 10/21/2015   Lab Results  Component Value Date   CHOL 182 06/20/2015   HDL 44.00 06/20/2015   LDLCALC 117* 06/20/2015   LDLDIRECT 129.0 10/21/2015   TRIG 105.0 06/20/2015   CHOLHDL 4 06/20/2015

## 2015-10-24 ENCOUNTER — Ambulatory Visit (INDEPENDENT_AMBULATORY_CARE_PROVIDER_SITE_OTHER): Payer: 59

## 2015-10-24 DIAGNOSIS — E119 Type 2 diabetes mellitus without complications: Secondary | ICD-10-CM

## 2015-10-24 NOTE — Progress Notes (Addendum)
Pt presented for diabetic teaching.  Educated on the use of a glucometer.  Logging sheet provided for her to record blood sugars twice daily.  Instructed to return log sheet at next follow up with PCP.  She demonstrated and verbalized understanding.  Routed to Dr. Lacinda Axon for signature.  PCP currently out of the office.

## 2015-10-25 NOTE — Progress Notes (Signed)
  I have reviewed the above information and agree with above.   Teresa Tullo, MD 

## 2015-10-28 ENCOUNTER — Telehealth: Payer: Self-pay | Admitting: Internal Medicine

## 2015-10-28 ENCOUNTER — Other Ambulatory Visit: Payer: Self-pay

## 2015-10-28 MED ORDER — GLUCOSE BLOOD VI STRP: ORAL_STRIP | Status: DC

## 2015-10-28 NOTE — Telephone Encounter (Signed)
Test strips sent to pharmacy.

## 2015-10-28 NOTE — Telephone Encounter (Signed)
Needing a refill on test strips One Touch Verio

## 2015-10-29 MED ORDER — ONETOUCH ULTRASOFT LANCETS MISC: Status: DC

## 2015-10-29 NOTE — Telephone Encounter (Signed)
Refill sent for lancets to [facility].

## 2015-11-06 NOTE — Progress Notes (Signed)
I have reviewed the information and agree with the note.  [place]

## 2015-11-15 ENCOUNTER — Encounter: Payer: Self-pay | Admitting: Internal Medicine

## 2015-11-15 ENCOUNTER — Ambulatory Visit (INDEPENDENT_AMBULATORY_CARE_PROVIDER_SITE_OTHER): Payer: 59 | Admitting: Internal Medicine

## 2015-11-15 VITALS — BP 118/84 | HR 83 | Temp 98.1°F | Resp 12 | Ht 61.0 in | Wt 176.5 lb

## 2015-11-15 DIAGNOSIS — E876 Hypokalemia: Secondary | ICD-10-CM | POA: Diagnosis not present

## 2015-11-15 DIAGNOSIS — I10 Essential (primary) hypertension: Secondary | ICD-10-CM

## 2015-11-15 DIAGNOSIS — E1159 Type 2 diabetes mellitus with other circulatory complications: Secondary | ICD-10-CM

## 2015-11-15 DIAGNOSIS — E1169 Type 2 diabetes mellitus with other specified complication: Principal | ICD-10-CM

## 2015-11-15 DIAGNOSIS — E669 Obesity, unspecified: Secondary | ICD-10-CM

## 2015-11-15 DIAGNOSIS — E119 Type 2 diabetes mellitus without complications: Secondary | ICD-10-CM

## 2015-11-15 MED ORDER — METFORMIN HCL ER 500 MG PO TB24: 500.0000 mg | ORAL_TABLET | Freq: Every day | ORAL | Status: DC

## 2015-11-15 NOTE — Progress Notes (Signed)
Pre-visit discussion using our clinic review tool. No additional management support is needed unless otherwise documented below in the visit note.  

## 2015-11-15 NOTE — Progress Notes (Signed)
Subjective:  Patient ID: Mariah Brandt, female    DOB: [birth year]  Age: 45 y.o. MRN: [id number]  CC: The primary encounter diagnosis was Obesity, diabetes, and hypertension syndrome ([place]). Diagnoses of Obesity (BMI 30-39.9), Essential hypertension, and Hypokalemia were also pertinent to this visit.  HPI Mariah Brandt presents for 3 month follow up on diet controlled diabetes.  Patient has no complaints today.  Patient is not following a low glycemic index diet .  Fasting sugars have been under less than 160 most of the time and post prandials have been under 200 except on rare occasions. Patient is not exercising and not  intentionally trying to lose weight .  Patient has had an eye exam in the last 12 months and checks feet regularly for signs of infection.  Patient does not walk barefoot outside, and denies any numbness tingling or burning in feet. Patient is up to date on all recommended vaccinations.  Lab Results  Component Value Date   HGBA1C 7.5* 10/21/2015     Outpatient Prescriptions Prior to Visit  Medication Sig Dispense Refill  . glucose blood test strip Use as instructed 100 each 12  . hydrochlorothiazide (HYDRODIURIL) 25 MG tablet   3  . Lactulose 20 GM/30ML SOLN Take 30 mLs (20 g total) by mouth every 6 (six) hours as needed. Until constipation is relieved 236 mL 3  . Lancets (ONETOUCH ULTRASOFT) lancets Use as instructed to test blood sugars twice a day 100 each 12  . medroxyPROGESTERone (DEPO-PROVERA) 150 MG/ML injection Inject 1 mL (150 mg total) into the muscle every 3 (three) months. 1 mL 4   No facility-administered medications prior to visit.    Review of Systems;  Patient denies headache, fevers, malaise, unintentional weight loss, skin rash, eye pain, sinus congestion and sinus pain, sore throat, dysphagia,  hemoptysis , cough, dyspnea, wheezing, chest pain, palpitations, orthopnea, edema, abdominal pain, nausea, melena, diarrhea,  constipation, flank pain, dysuria, hematuria, urinary  Frequency, nocturia, numbness, tingling, seizures,  Focal weakness, Loss of consciousness,  Tremor,  depression, , and suicidal ideation.      Objective:  BP 118/84 mmHg  Pulse 83  Temp(Src) 98.1 F (36.7 C) (Oral)  Resp 12  Ht 5' 1"  (1.549 m)  Wt 176 lb 8 oz (80.06 kg)  BMI 33.37 kg/m2  SpO2 99%  BP Readings from Last 3 Encounters:  11/15/15 118/84  10/21/15 122/86  10/15/15 106/75    Wt Readings from Last 3 Encounters:  11/15/15 176 lb 8 oz (80.06 kg)  10/21/15 176 lb 8 oz (80.06 kg)  10/15/15 173 lb 8 oz (78.699 kg)    General appearance: alert, cooperative and appears stated age Ears: normal TM's and external ear canals both ears Throat: lips, mucosa, and tongue normal; teeth and gums normal Neck: no adenopathy, no carotid bruit, supple, symmetrical, trachea midline and thyroid not enlarged, symmetric, no tenderness/mass/nodules Back: symmetric, no curvature. ROM normal. No CVA tenderness. Lungs: clear to auscultation bilaterally Heart: regular rate and rhythm, S1, S2 normal, no murmur, click, rub or gallop Abdomen: soft, non-tender; bowel sounds normal; no masses,  no organomegaly Pulses: 2+ and symmetric Skin: Skin color, texture, turgor normal. No rashes or lesions Lymph nodes: Cervical, supraclavicular, and axillary nodes normal.  Lab Results  Component Value Date   HGBA1C 7.5* 10/21/2015   HGBA1C 6.7* 06/20/2015   HGBA1C 5.9 12/20/2013    Lab Results  Component Value Date   CREATININE 0.97 10/21/2015   CREATININE 0.73 06/20/2015  CREATININE 0.7 07/25/2013    Lab Results  Component Value Date   WBC 5.2 04/11/2015   HGB 14.2 04/11/2015   HCT 43.0 04/11/2015   PLT 274 04/11/2015   GLUCOSE 149* 10/21/2015   CHOL 182 06/20/2015   TRIG 105.0 06/20/2015   HDL 44.00 06/20/2015   LDLDIRECT 129.0 10/21/2015   LDLCALC 117* 06/20/2015   ALT 13 10/21/2015   AST 15 10/21/2015   NA 139 10/21/2015   K  3.1* 10/21/2015   CL 102 10/21/2015   CREATININE 0.97 10/21/2015   BUN 12 10/21/2015   CO2 26 10/21/2015   TSH 1.410 03/12/2015   HGBA1C 7.5* 10/21/2015   MICROALBUR 1.2 10/21/2015    No results found.  Assessment & Plan:   Problem List Items Addressed This Visit    Obesity (BMI 30-39.9)    I have addressed  BMI and recommended a low glycemic index diet utilizing smaller more frequent meals to increase metabolism.  I have also recommended that patient start exercising with a goal of 30 minutes of aerobic exercise a minimum of 5 days per week. Screening for lipid disorders, thyroid and diabetes to be done today.        Relevant Medications   metFORMIN (GLUCOPHAGE-XR) 500 MG 24 hr tablet   Obesity, diabetes, and hypertension syndrome ([place]) - Primary    Starting metformin xr for loss of control .  Lab Results  Component Value Date   HGBA1C 7.5* 10/21/2015   Lab Results  Component Value Date   MICROALBUR 1.2 10/21/2015         Relevant Medications   metFORMIN (GLUCOPHAGE-XR) 500 MG 24 hr tablet   Other Relevant Orders   Comprehensive metabolic panel   Hemoglobin A1c   Lipid panel   Essential hypertension    Hctz was stopped in March.  BP is normal today  Lab Results  Component Value Date   CREATININE 0.97 10/21/2015   Lab Results  Component Value Date   NA 139 10/21/2015   K 3.1* 10/21/2015   CL 102 10/21/2015   CO2 26 10/21/2015         Hypokalemia    Secondary to use of diuretic.  Continue daily potassium supplement.         A total of 25 minutes of face to face time was spent with patient more than half of which was spent in counselling and coordination of care   I am having Mariah Brandt start on metFORMIN. I am also having her maintain her Lactulose, medroxyPROGESTERone, hydrochlorothiazide, glucose blood, and onetouch ultrasoft.  Meds ordered this encounter  Medications  . metFORMIN (GLUCOPHAGE-XR) 500 MG 24 hr tablet    Sig: Take 1  tablet (500 mg total) by mouth daily with breakfast.    Dispense:  90 tablet    Refill:  1    There are no discontinued medications.  Follow-up: Return in about 3 months (around 02/14/2016) for  labs on or afer Jun 13 .   Crecencio Mc, MD

## 2015-11-15 NOTE — Patient Instructions (Addendum)
Mariah Brandt now makes a frozen breakfast frittata that can be microwaved in 2 minutes and is very low carb. Frittatas are similar to quiches without the crust  Try the [employer] cereal available at [facility] on the bottom shelf, with vanilla flavored almond milk  [employer] sells an organic almond mik, non refrigerated,  Called :"orgain"  That is fortified with protein and very low carb    We are starting metformin XR 500 mg every morning  Decrease  Your  BS checks to random one daily   Return in 3 months  Get labs after June 13th   Metformin extended-release tablets What is this medicine? METFORMIN (met FOR min) is used to treat type 2 diabetes. It helps to control blood sugar. Treatment is combined with diet and exercise. This medicine can be used alone or with other medicines for diabetes. This medicine may be used for other purposes; ask your health care provider or pharmacist if you have questions. What should I tell my health care provider before I take this medicine? They need to know if you have any of these conditions: -anemia -frequently drink alcohol-containing beverages -become easily dehydrated -heart attack -heart failure -kidney disease -liver disease -polycystic ovary syndrome -serious infection or injury -vomiting -an unusual or allergic reaction to metformin, other medicines, foods, dyes, or preservatives -pregnant or trying to get pregnant -breast-feeding How should I use this medicine? Take this medicine by mouth with a glass of water. Take it with meals. Swallow whole, do not crush or chew. Follow the directions on the prescription label. Take your medicine at regular intervals. Do not take your medicine more often than directed. Talk to your pediatrician regarding the use of this medicine in children. Special care may be needed. Overdosage: If you think you have taken too much of this medicine contact a poison control center or emergency room at once. NOTE:  This medicine is only for you. Do not share this medicine with others. What if I miss a dose? If you miss a dose, take it as soon as you can. If it is almost time for your next dose, take only that dose. Do not take double or extra doses. What may interact with this medicine? Do not take this medicine with any of the following medications: -dofetilide -gatifloxacin -certain contrast medicines given before X-rays, CT scans, MRI, or other procedures This medicine may also interact with the following medications: -acetazolamide -certain medicines for HIV infection or hepatitis, like adefovir, emtricitabine, entecavir, lamivudine, or tenofovir -cimetidine -crizotinib -digoxin -diuretics -female hormones, like estrogens or progestins and birth control pills -glycopyrrolate -isoniazid -lamotrigine -medicines for blood pressure, heart disease, irregular heart beat -memantine -midodrine -methazolamide -morphine -nicotinic acid -phenothiazines like chlorpromazine, mesoridazine, prochlorperazine, thioridazine -phenytoin -procainamide -propantheline -quinidine -quinine -ranitidine -ranolazine -steroid medicines like prednisone or cortisone -stimulant medicines for attention disorders, weight loss, or to stay awake -thyroid medicines -topiramate -trimethoprim -trospium -vancomycin -vandetanib -zonisamide This list may not describe all possible interactions. Give your health care provider a list of all the medicines, herbs, non-prescription drugs, or dietary supplements you use. Also tell them if you smoke, drink alcohol, or use illegal drugs. Some items may interact with your medicine. What should I watch for while using this medicine? Visit your doctor or health care professional for regular checks on your progress. A test called the HbA1C (A1C) will be monitored. This is a simple blood test. It measures your blood sugar control over the last 2 to 3 months. You will receive  this  test every 3 to 6 months. Learn how to check your blood sugar. Learn the symptoms of low and high blood sugar and how to manage them. Always carry a quick-source of sugar with you in case you have symptoms of low blood sugar. Examples include hard sugar candy or glucose tablets. Make sure others know that you can choke if you eat or drink when you develop serious symptoms of low blood sugar, such as seizures or unconsciousness. They must get medical help at once. Tell your doctor or health care professional if you have high blood sugar. You might need to change the dose of your medicine. If you are sick or exercising more than usual, you might need to change the dose of your medicine. Do not skip meals. Ask your doctor or health care professional if you should avoid alcohol. Many nonprescription cough and cold products contain sugar or alcohol. These can affect blood sugar. This medicine may cause ovulation in premenopausal women who do not have regular monthly periods. This may increase your chances of becoming pregnant. You should not take this medicine if you become pregnant or think you may be pregnant. Talk with your doctor or health care professional about your birth control options while taking this medicine. Contact your doctor or health care professional right away if think you are pregnant. The tablet shell for some brands of this medicine does not dissolve. This is normal. The tablet shell may appear whole in the stool. This is not a cause for concern. If you are going to need surgery, a MRI, CT scan, or other procedure, tell your doctor that you are taking this medicine. You may need to stop taking this medicine before the procedure. Wear a medical ID bracelet or chain, and carry a card that describes your disease and details of your medicine and dosage times. What side effects may I notice from receiving this medicine? Side effects that you should report to your doctor or health care  professional as soon as possible: -allergic reactions like skin rash, itching or hives, swelling of the face, lips, or tongue -breathing problems -feeling faint or lightheaded, falls -muscle aches or pains -signs and symptoms of low blood sugar such as feeling anxious, confusion, dizziness, increased hunger, unusually weak or tired, sweating, shakiness, cold, irritable, headache, blurred vision, fast heartbeat, loss of consciousness -slow or irregular heartbeat -unusual stomach pain or discomfort -unusually tired or weak Side effects that usually do not require medical attention (report to your doctor or health care professional if they continue or are bothersome): -diarrhea -headache -heartburn -metallic taste in mouth -nausea -stomach gas, upset This list may not describe all possible side effects. Call your doctor for medical advice about side effects. You may report side effects to FDA at 1-800-FDA-1088. Where should I keep my medicine? Keep out of the reach of children. Store at room temperature between 15 and 30 degrees C (59 and 86 degrees F). Protect from light. Throw away any unused medicine after the expiration date. NOTE: This sheet is a summary. It may not cover all possible information. If you have questions about this medicine, talk to your doctor, pharmacist, or health care provider.    2016, [employer]vier/[employer] Standard. (2014-01-09 22:12:16)

## 2015-11-17 NOTE — Assessment & Plan Note (Signed)
I have addressed  BMI and recommended a low glycemic index diet utilizing smaller more frequent meals to increase metabolism.  I have also recommended that patient start exercising with a goal of 30 minutes of aerobic exercise a minimum of 5 days per week. Screening for lipid disorders, thyroid and diabetes to be done today.   

## 2015-11-17 NOTE — Assessment & Plan Note (Signed)
Secondary to use of diuretic.  Continue daily potassium supplement.

## 2015-11-17 NOTE — Assessment & Plan Note (Signed)
Starting metformin xr for loss of control .  Lab Results  Component Value Date   HGBA1C 7.5* 10/21/2015   Lab Results  Component Value Date   MICROALBUR 1.2 10/21/2015

## 2015-11-17 NOTE — Assessment & Plan Note (Addendum)
Hctz was stopped in March.  BP is normal today  Lab Results  Component Value Date   CREATININE 0.97 10/21/2015   Lab Results  Component Value Date   NA 139 10/21/2015   K 3.1* 10/21/2015   CL 102 10/21/2015   CO2 26 10/21/2015

## 2015-12-02 ENCOUNTER — Encounter: Payer: Self-pay | Admitting: Internal Medicine

## 2015-12-03 ENCOUNTER — Other Ambulatory Visit: Payer: Self-pay | Admitting: Internal Medicine

## 2015-12-03 DIAGNOSIS — I10 Essential (primary) hypertension: Principal | ICD-10-CM

## 2015-12-03 DIAGNOSIS — E1159 Type 2 diabetes mellitus with other circulatory complications: Secondary | ICD-10-CM

## 2015-12-03 DIAGNOSIS — I152 Hypertension secondary to endocrine disorders: Secondary | ICD-10-CM

## 2015-12-03 DIAGNOSIS — E669 Obesity, unspecified: Secondary | ICD-10-CM

## 2015-12-03 DIAGNOSIS — E1169 Type 2 diabetes mellitus with other specified complication: Principal | ICD-10-CM

## 2015-12-03 MED ORDER — GLIPIZIDE 5 MG PO TABS: 2.5000 mg | ORAL_TABLET | Freq: Two times a day (BID) | ORAL | Status: DC

## 2015-12-31 ENCOUNTER — Ambulatory Visit (INDEPENDENT_AMBULATORY_CARE_PROVIDER_SITE_OTHER): Payer: 59 | Admitting: Obstetrics and Gynecology

## 2015-12-31 ENCOUNTER — Ambulatory Visit: Payer: 59

## 2015-12-31 VITALS — BP 106/74 | HR 101 | Ht 61.0 in | Wt 171.5 lb

## 2015-12-31 DIAGNOSIS — Z3042 Encounter for surveillance of injectable contraceptive: Secondary | ICD-10-CM

## 2015-12-31 MED ORDER — MEDROXYPROGESTERONE ACETATE 150 MG/ML IM SUSP
150.0000 mg | Freq: Once | INTRAMUSCULAR | Status: AC
  Administered 2015-12-31: 150 mg via INTRAMUSCULAR

## 2015-12-31 NOTE — Progress Notes (Signed)
Date last pap: 02/19/2015 Last Depo-Provera: 10/15/2015 Side Effects if any: None. Serum HCG indicated? No pt within allotted window. Depo-Provera 150 mg IM given by: Gordy Clement, Emily  Next appointment due: August 8-22 2017

## 2015-12-31 NOTE — Patient Instructions (Signed)
Follow up in 35mo for depo injection.

## 2016-01-10 DIAGNOSIS — H2513 Age-related nuclear cataract, bilateral: Secondary | ICD-10-CM | POA: Diagnosis not present

## 2016-01-10 DIAGNOSIS — Z7984 Long term (current) use of oral hypoglycemic drugs: Secondary | ICD-10-CM | POA: Diagnosis not present

## 2016-01-10 DIAGNOSIS — E119 Type 2 diabetes mellitus without complications: Secondary | ICD-10-CM | POA: Diagnosis not present

## 2016-01-10 DIAGNOSIS — H18413 Arcus senilis, bilateral: Secondary | ICD-10-CM | POA: Diagnosis not present

## 2016-01-10 DIAGNOSIS — H11153 Pinguecula, bilateral: Secondary | ICD-10-CM | POA: Diagnosis not present

## 2016-01-10 DIAGNOSIS — H52223 Regular astigmatism, bilateral: Secondary | ICD-10-CM | POA: Diagnosis not present

## 2016-01-10 DIAGNOSIS — H524 Presbyopia: Secondary | ICD-10-CM | POA: Diagnosis not present

## 2016-01-10 DIAGNOSIS — H11423 Conjunctival edema, bilateral: Secondary | ICD-10-CM | POA: Diagnosis not present

## 2016-01-22 ENCOUNTER — Ambulatory Visit (INDEPENDENT_AMBULATORY_CARE_PROVIDER_SITE_OTHER): Payer: 59 | Admitting: Internal Medicine

## 2016-01-22 VITALS — BP 124/76 | HR 95 | Temp 98.4°F | Ht 61.0 in | Wt 175.2 lb

## 2016-01-22 DIAGNOSIS — E1159 Type 2 diabetes mellitus with other circulatory complications: Secondary | ICD-10-CM

## 2016-01-22 DIAGNOSIS — E1169 Type 2 diabetes mellitus with other specified complication: Secondary | ICD-10-CM

## 2016-01-22 DIAGNOSIS — E876 Hypokalemia: Secondary | ICD-10-CM

## 2016-01-22 DIAGNOSIS — I10 Essential (primary) hypertension: Secondary | ICD-10-CM | POA: Diagnosis not present

## 2016-01-22 DIAGNOSIS — E669 Obesity, unspecified: Secondary | ICD-10-CM

## 2016-01-22 DIAGNOSIS — N611 Abscess of the breast and nipple: Secondary | ICD-10-CM

## 2016-01-22 DIAGNOSIS — E119 Type 2 diabetes mellitus without complications: Secondary | ICD-10-CM | POA: Diagnosis not present

## 2016-01-22 MED ORDER — DOXYCYCLINE HYCLATE 100 MG PO TABS: 100.0000 mg | ORAL_TABLET | Freq: Two times a day (BID) | ORAL | Status: DC

## 2016-01-22 NOTE — Progress Notes (Signed)
Pre-visit discussion using our clinic review tool. No additional management support is needed unless otherwise documented below in the visit note.  

## 2016-01-22 NOTE — Progress Notes (Signed)
Subjective:  Patient ID: Chassidy Layson Eshbach-Hendrix, female    DOB: 07/19/1971  Age: 45 y.o. MRN: [id number]  CC: The primary encounter diagnosis was Boil, breast. Diagnoses of Obesity, diabetes, and hypertension syndrome ([place]), Essential hypertension, and Hypokalemia were also pertinent to this visit.  HPI Shelbe R Sylva-Hendrix presents for 1) DM follow up   2) evaluation of several cystic nodules under and between her breasts.   Lab Results  Component Value Date   HGBA1C 6.9* 01/22/2016    She has been complaint with taking glipizide 2.5  Mg twice daily.  Saw eye doctor steve berhnsdorff june 2nd for annual exam.  Does not check sugars very often.  She is not exercising during the week but has started going for a 30 miinute walk on the weekends only.  Weight has fluctuated again , up 4 lbs no significant improvement since 2012. Diet and exercise dicussed for 15 minutes of the 25 minute visit.   2) recurrent pustular /cystic nodules,.  Used to have them in her groin area, suggestive of HS.  Never required.   Incision /drainage. Oste would spontaneously drain and resolve. None are currently tender .   Outpatient Prescriptions Prior to Visit  Medication Sig Dispense Refill  . glipiZIDE (GLUCOTROL) 5 MG tablet Take 0.5 tablets (2.5 mg total) by mouth 2 (two) times daily before a meal. 30 tablet 3  . glucose blood test strip Use as instructed 100 each 12  . Lactulose 20 GM/30ML SOLN Take 30 mLs (20 g total) by mouth every 6 (six) hours as needed. Until constipation is relieved 236 mL 3  . Lancets (ONETOUCH ULTRASOFT) lancets Use as instructed to test blood sugars twice a day 100 each 12  . medroxyPROGESTERone (DEPO-PROVERA) 150 MG/ML injection Inject 1 mL (150 mg total) into the muscle every 3 (three) months. 1 mL 4  . hydrochlorothiazide (HYDRODIURIL) 25 MG tablet   3   No facility-administered medications prior to visit.    Review of Systems;  Patient denies headache, fevers,  malaise, unintentional weight loss, skin rash, eye pain, sinus congestion and sinus pain, sore throat, dysphagia,  hemoptysis , cough, dyspnea, wheezing, chest pain, palpitations, orthopnea, edema, abdominal pain, nausea, melena, diarrhea, constipation, flank pain, dysuria, hematuria, urinary  Frequency, nocturia, numbness, tingling, seizures,  Focal weakness, Loss of consciousness,  Tremor, insomnia, depression, anxiety, and suicidal ideation.      Objective:  BP 124/76 mmHg  Pulse 95  Temp(Src) 98.4 F (36.9 C) (Oral)  Ht 5' 1"  (1.549 m)  Wt 175 lb 4 oz (79.493 kg)  BMI 33.13 kg/m2  SpO2 98%  BP Readings from Last 3 Encounters:  01/22/16 124/76  12/31/15 106/74  11/15/15 118/84    Wt Readings from Last 3 Encounters:  01/22/16 175 lb 4 oz (79.493 kg)  12/31/15 171 lb 8 oz (77.792 kg)  11/15/15 176 lb 8 oz (80.06 kg)    General appearance: alert, cooperative and appears stated age Back: symmetric, no curvature. ROM normal. No CVA tenderness. Lungs: clear to auscultation bilaterally Heart: regular rate and rhythm, S1, S2 normal, no murmur, click, rub or gallop Abdomen: obese, soft, non-tender; bowel sounds normal; no masses,  no organomegaly Pulses: 2+ and symmetric Skin: several small pustular cysts, tow under breasts one on sternum. Skin color, texture, turgor normal. No rashes or lesions Lymph nodes: Cervical, supraclavicular, and axillary nodes normal.  Lab Results  Component Value Date   HGBA1C 6.9* 01/22/2016   HGBA1C 7.5* 10/21/2015   HGBA1C  6.7* 06/20/2015    Lab Results  Component Value Date   CREATININE 0.77 01/22/2016   CREATININE 0.97 10/21/2015   CREATININE 0.73 06/20/2015    Lab Results  Component Value Date   WBC 5.2 04/11/2015   HGB 14.2 04/11/2015   HCT 43.0 04/11/2015   PLT 274 04/11/2015   GLUCOSE 153* 01/22/2016   CHOL 182 06/20/2015   TRIG 105.0 06/20/2015   HDL 44.00 06/20/2015   LDLDIRECT 129.0 10/21/2015   LDLCALC 117* 06/20/2015    ALT 13 01/22/2016   AST 14 01/22/2016   NA 139 01/22/2016   K 3.2* 01/22/2016   CL 104 01/22/2016   CREATININE 0.77 01/22/2016   BUN 8 01/22/2016   CO2 29 01/22/2016   TSH 1.410 03/12/2015   HGBA1C 6.9* 01/22/2016   MICROALBUR 1.2 10/21/2015    No results found.  Assessment & Plan:   Problem List Items Addressed This Visit    Obesity, diabetes, and hypertension syndrome ([place])    She did not tolerate metformin trial because it resulted in a 30 day menstrual bleed ,  Suggestive of PCOS. Marland Kitchen  tolerating glipizide with good reduction in a1c to < 7.0 no changes today. Strongly encouraged to increase her activity to 3 walks/week immediatley , with a goal of 5/week.  Weight loss encouraged.   Lab Results  Component Value Date   HGBA1C 6.9* 01/22/2016   Lab Results  Component Value Date   MICROALBUR 1.2 10/21/2015         Essential hypertension    Well controlled on current regimen. Renal function stable, no changes today.  Lab Results  Component Value Date   CREATININE 0.77 01/22/2016   Lab Results  Component Value Date   NA 139 01/22/2016   K 3.2* 01/22/2016   CL 104 01/22/2016   CO2 29 01/22/2016         Relevant Medications   spironolactone (ALDACTONE) 50 MG tablet   Hypokalemia    Recurrent , due to hcz.  Will change diuretic to spironolactone.       Boil, breast - Primary    Will treat for MRSA infection with doxycycline x 7 days . Warm compresses and use of cotton gauze to managed drainage and prevent concurrent yeast infection due to pendulous breasts         I have discontinued Ms. Tabron-Hendrix's hydrochlorothiazide. I am also having her start on doxycycline and spironolactone. Additionally, I am having her maintain her Lactulose, medroxyPROGESTERone, glucose blood, onetouch ultrasoft, and glipiZIDE.  Meds ordered this encounter  Medications  . doxycycline (VIBRA-TABS) 100 MG tablet    Sig: Take 1 tablet (100 mg total) by mouth 2 (two) times  daily.    Dispense:  14 tablet    Refill:  0  . spironolactone (ALDACTONE) 50 MG tablet    Sig: Take 1 tablet (50 mg total) by mouth daily.    Dispense:  30 tablet    Refill:  3    Medications Discontinued During This Encounter  Medication Reason  . hydrochlorothiazide (HYDRODIURIL) 25 MG tablet     Follow-up: No Follow-up on file.   Crecencio Mc, MD

## 2016-01-22 NOTE — Patient Instructions (Addendum)
Continue glipizide at 2.5 mg twice daily  Increase walks to 3 times weekly 30 minutes  Doxycycline twice daily with food for one week  Warm compresses to painful areas.   keep areas dry  with gauze   Daily yogurt x 3 weeks to prevent c dif colitis

## 2016-01-23 ENCOUNTER — Encounter: Payer: Self-pay | Admitting: Internal Medicine

## 2016-01-23 LAB — COMPREHENSIVE METABOLIC PANEL
ALT: 13 U/L (ref 0–35)
AST: 14 U/L (ref 0–37)
Albumin: 4.3 g/dL (ref 3.5–5.2)
Alkaline Phosphatase: 109 U/L (ref 39–117)
BILIRUBIN TOTAL: 0.4 mg/dL (ref 0.2–1.2)
BUN: 8 mg/dL (ref 6–23)
CALCIUM: 9.3 mg/dL (ref 8.4–10.5)
CHLORIDE: 104 meq/L (ref 96–112)
CO2: 29 meq/L (ref 19–32)
Creatinine, Ser: 0.77 mg/dL (ref 0.40–1.20)
GFR: 105.2 mL/min (ref >60.00)
GLUCOSE: 153 mg/dL — AB (ref 70–99)
Potassium: 3.2 mEq/L — ABNORMAL LOW (ref 3.5–5.1)
Sodium: 139 mEq/L (ref 135–145)
Total Protein: 7.8 g/dL (ref 6.0–8.3)

## 2016-01-23 LAB — HEMOGLOBIN A1C: Hgb A1c MFr Bld: 6.9 % — ABNORMAL HIGH (ref 4.6–6.5)

## 2016-01-23 MED ORDER — SPIRONOLACTONE 50 MG PO TABS: 50.0000 mg | ORAL_TABLET | Freq: Every day | ORAL | Status: DC

## 2016-01-23 NOTE — Assessment & Plan Note (Addendum)
Will treat for MRSA infection with doxycycline x 7 days . Warm compresses and use of cotton gauze to managed drainage and prevent concurrent yeast infection due to pendulous breasts

## 2016-01-23 NOTE — Assessment & Plan Note (Signed)
Recurrent , due to hcz.  Will change diuretic to spironolactone.

## 2016-01-23 NOTE — Assessment & Plan Note (Addendum)
She did not tolerate metformin trial because it resulted in a 30 day menstrual bleed ,  Suggestive of PCOS. Mariah Brandt  tolerating glipizide with good reduction in a1c to < 7.0 no changes today. Strongly encouraged to increase her activity to 3 walks/week immediatley , with a goal of 5/week.  Weight loss encouraged.   Lab Results  Component Value Date   HGBA1C 6.9* 01/22/2016   Lab Results  Component Value Date   MICROALBUR 1.2 10/21/2015

## 2016-01-23 NOTE — Assessment & Plan Note (Signed)
Well controlled on current regimen. Renal function stable, no changes today.  Lab Results  Component Value Date   CREATININE 0.77 01/22/2016   Lab Results  Component Value Date   NA 139 01/22/2016   K 3.2* 01/22/2016   CL 104 01/22/2016   CO2 29 01/22/2016

## 2016-02-12 ENCOUNTER — Other Ambulatory Visit: Payer: Self-pay

## 2016-02-13 ENCOUNTER — Other Ambulatory Visit (INDEPENDENT_AMBULATORY_CARE_PROVIDER_SITE_OTHER): Payer: 59

## 2016-02-13 ENCOUNTER — Ambulatory Visit (INDEPENDENT_AMBULATORY_CARE_PROVIDER_SITE_OTHER): Payer: 59

## 2016-02-13 VITALS — BP 118/78 | HR 90 | Resp 18

## 2016-02-13 DIAGNOSIS — I10 Essential (primary) hypertension: Secondary | ICD-10-CM

## 2016-02-13 DIAGNOSIS — E119 Type 2 diabetes mellitus without complications: Secondary | ICD-10-CM

## 2016-02-13 DIAGNOSIS — E1169 Type 2 diabetes mellitus with other specified complication: Principal | ICD-10-CM

## 2016-02-13 DIAGNOSIS — E669 Obesity, unspecified: Secondary | ICD-10-CM

## 2016-02-13 DIAGNOSIS — E1159 Type 2 diabetes mellitus with other circulatory complications: Secondary | ICD-10-CM

## 2016-02-13 NOTE — Progress Notes (Signed)
Patient came in for a 2 week check of her BP, Provider changed medications.  She has had no issues with new meds.  Hasn't checked her BP at home.  Checked vitals, see documentation.    Please advise in Dr. Lupita Dawn absence. thanks

## 2016-02-13 NOTE — Progress Notes (Signed)
BP at goal. Follow up with Dr. Derrel Nip.

## 2016-02-14 LAB — LIPID PANEL
CHOL/HDL RATIO: 4
CHOLESTEROL: 181 mg/dL (ref 0–200)
HDL: 47.9 mg/dL (ref >39.00)
NonHDL: 133.36
Triglycerides: 233 mg/dL — ABNORMAL HIGH (ref 0.0–149.0)
VLDL: 46.6 mg/dL — AB (ref 0.0–40.0)

## 2016-02-14 LAB — LDL CHOLESTEROL, DIRECT: Direct LDL: 121 mg/dL

## 2016-02-16 ENCOUNTER — Encounter: Payer: Self-pay | Admitting: Internal Medicine

## 2016-03-17 ENCOUNTER — Ambulatory Visit (INDEPENDENT_AMBULATORY_CARE_PROVIDER_SITE_OTHER): Payer: 59 | Admitting: Obstetrics and Gynecology

## 2016-03-17 VITALS — BP 121/87 | HR 81 | Wt 175.1 lb

## 2016-03-17 DIAGNOSIS — Z3040 Encounter for surveillance of contraceptives, unspecified: Secondary | ICD-10-CM

## 2016-03-17 MED ORDER — MEDROXYPROGESTERONE ACETATE 150 MG/ML IM SUSP
150.0000 mg | Freq: Once | INTRAMUSCULAR | Status: AC
  Administered 2016-03-17: 150 mg via INTRAMUSCULAR

## 2016-03-17 MED ORDER — MEDROXYPROGESTERONE ACETATE 150 MG/ML IM SUSP: 150.0000 mg | INTRAMUSCULAR | 0 refills | Status: DC

## 2016-03-17 NOTE — Progress Notes (Signed)
Pt presents for depo-provera for contraception. No c/o side effects.

## 2016-04-17 ENCOUNTER — Encounter: Payer: Self-pay | Admitting: Internal Medicine

## 2016-04-17 MED ORDER — GLIPIZIDE 5 MG PO TABS: 2.5000 mg | ORAL_TABLET | Freq: Two times a day (BID) | ORAL | 3 refills | Status: DC

## 2016-04-23 ENCOUNTER — Encounter: Payer: Self-pay | Admitting: Internal Medicine

## 2016-04-24 ENCOUNTER — Other Ambulatory Visit: Payer: Self-pay | Admitting: Internal Medicine

## 2016-04-24 MED ORDER — CITALOPRAM HYDROBROMIDE 10 MG PO TABS: 10.0000 mg | ORAL_TABLET | Freq: Every day | ORAL | 0 refills | Status: DC

## 2016-04-24 NOTE — Progress Notes (Signed)
citalo

## 2016-05-25 ENCOUNTER — Encounter: Payer: Self-pay | Admitting: Internal Medicine

## 2016-05-25 ENCOUNTER — Ambulatory Visit (INDEPENDENT_AMBULATORY_CARE_PROVIDER_SITE_OTHER): Payer: 59 | Admitting: Internal Medicine

## 2016-05-25 VITALS — BP 138/88 | HR 87 | Temp 98.4°F | Resp 10 | Ht 61.0 in | Wt 177.5 lb

## 2016-05-25 DIAGNOSIS — E119 Type 2 diabetes mellitus without complications: Secondary | ICD-10-CM

## 2016-05-25 DIAGNOSIS — F411 Generalized anxiety disorder: Secondary | ICD-10-CM

## 2016-05-25 DIAGNOSIS — E785 Hyperlipidemia, unspecified: Secondary | ICD-10-CM | POA: Diagnosis not present

## 2016-05-25 DIAGNOSIS — E669 Obesity, unspecified: Secondary | ICD-10-CM | POA: Diagnosis not present

## 2016-05-25 DIAGNOSIS — E1159 Type 2 diabetes mellitus with other circulatory complications: Secondary | ICD-10-CM

## 2016-05-25 DIAGNOSIS — I10 Essential (primary) hypertension: Secondary | ICD-10-CM

## 2016-05-25 DIAGNOSIS — E876 Hypokalemia: Secondary | ICD-10-CM

## 2016-05-25 DIAGNOSIS — R5383 Other fatigue: Secondary | ICD-10-CM

## 2016-05-25 DIAGNOSIS — E1169 Type 2 diabetes mellitus with other specified complication: Principal | ICD-10-CM

## 2016-05-25 LAB — CBC WITH DIFFERENTIAL/PLATELET
BASOS PCT: 0.6 % (ref 0.0–3.0)
Basophils Absolute: 0 10*3/uL (ref 0.0–0.1)
EOS PCT: 0.8 % (ref 0.0–5.0)
Eosinophils Absolute: 0 10*3/uL (ref 0.0–0.7)
HEMATOCRIT: 40.2 % (ref 36.0–46.0)
HEMOGLOBIN: 13.6 g/dL (ref 12.0–15.0)
LYMPHS PCT: 35.2 % (ref 12.0–46.0)
Lymphs Abs: 2.1 10*3/uL (ref 0.7–4.0)
MCHC: 33.8 g/dL (ref 30.0–36.0)
MCV: 90.5 fl (ref 78.0–100.0)
MONOS PCT: 10.5 % (ref 3.0–12.0)
Monocytes Absolute: 0.6 10*3/uL (ref 0.1–1.0)
NEUTROS ABS: 3.1 10*3/uL (ref 1.4–7.7)
Neutrophils Relative %: 52.9 % (ref 43.0–77.0)
PLATELETS: 247 10*3/uL (ref 150.0–400.0)
RBC: 4.44 Mil/uL (ref 3.87–5.11)
RDW: 14.1 % (ref 11.5–15.5)
WBC: 5.9 10*3/uL (ref 4.0–10.5)

## 2016-05-25 LAB — LDL CHOLESTEROL, DIRECT: LDL DIRECT: 113 mg/dL

## 2016-05-25 LAB — COMPREHENSIVE METABOLIC PANEL
ALBUMIN: 4.5 g/dL (ref 3.5–5.2)
ALT: 13 U/L (ref 0–35)
AST: 16 U/L (ref 0–37)
Alkaline Phosphatase: 102 U/L (ref 39–117)
BILIRUBIN TOTAL: 0.9 mg/dL (ref 0.2–1.2)
BUN: 9 mg/dL (ref 6–23)
CALCIUM: 9.6 mg/dL (ref 8.4–10.5)
CO2: 28 mEq/L (ref 19–32)
CREATININE: 0.71 mg/dL (ref 0.40–1.20)
Chloride: 108 mEq/L (ref 96–112)
GFR: 115.34 mL/min (ref >60.00)
Glucose, Bld: 56 mg/dL — ABNORMAL LOW (ref 70–99)
Potassium: 3.5 mEq/L (ref 3.5–5.1)
Sodium: 142 mEq/L (ref 135–145)
Total Protein: 7.7 g/dL (ref 6.0–8.3)

## 2016-05-25 LAB — LIPID PANEL
CHOLESTEROL: 173 mg/dL (ref 0–200)
HDL: 46.5 mg/dL (ref >39.00)
LDL Cholesterol: 100 mg/dL — ABNORMAL HIGH (ref 0–99)
NONHDL: 126.46
Total CHOL/HDL Ratio: 4
Triglycerides: 132 mg/dL (ref 0.0–149.0)
VLDL: 26.4 mg/dL (ref 0.0–40.0)

## 2016-05-25 LAB — MICROALBUMIN / CREATININE URINE RATIO
CREATININE, U: 221.2 mg/dL
MICROALB UR: 1.5 mg/dL (ref 0.0–1.9)
MICROALB/CREAT RATIO: 0.7 mg/g (ref 0.0–30.0)

## 2016-05-25 LAB — HEMOGLOBIN A1C: HEMOGLOBIN A1C: 6.8 % — AB (ref 4.6–6.5)

## 2016-05-25 NOTE — Assessment & Plan Note (Signed)
I have addressed  BMI and recommended wt loss of 10% of body weigh over the next 6 months using a low glycemic index diet and regular exercise a minimum of 5 days per week.   

## 2016-05-25 NOTE — Progress Notes (Signed)
Pre-visit discussion using our clinic review tool. No additional management support is needed unless otherwise documented below in the visit note.  

## 2016-05-25 NOTE — Assessment & Plan Note (Signed)
She did not tolerate metformin trial because it resulted in a 30 day menstrual bleed ,  Suggestive of PCOS. .  She is tolerating glipizide with good reduction in a1c to < 7.0 . no changes today. Strongly encouraged to increase her activity to 3 30 minutes walks on treadmill . , with a goal of 5/week.  Weight loss encouraged.   Lab Results  Component Value Date   HGBA1C 6.9 (H) 01/22/2016   Lab Results  Component Value Date   MICROALBUR 1.2 10/21/2015
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
## 2016-05-25 NOTE — Patient Instructions (Signed)
I have refilled your celexa.    Please start exercising in the morning, with a goal of 30 minutes 5 days per week    I would like you to lose 18 lbs (10%) of your body weight ) over the next 6 months

## 2016-05-25 NOTE — Assessment & Plan Note (Signed)
Stressors identified by patient.  Continue citalopram at current dose per patient preference.  Advised to add daily exercise to manage stress.  Very resistant to the idea despite having a treadmill in an office at home.

## 2016-05-27 ENCOUNTER — Encounter: Payer: Self-pay | Admitting: Internal Medicine

## 2016-05-28 ENCOUNTER — Other Ambulatory Visit: Payer: Self-pay | Admitting: Internal Medicine

## 2016-06-02 ENCOUNTER — Ambulatory Visit (INDEPENDENT_AMBULATORY_CARE_PROVIDER_SITE_OTHER): Payer: 59 | Admitting: Obstetrics and Gynecology

## 2016-06-02 ENCOUNTER — Encounter: Payer: Self-pay | Admitting: Obstetrics and Gynecology

## 2016-06-02 VITALS — BP 130/83 | HR 84 | Ht 61.0 in | Wt 176.3 lb

## 2016-06-02 DIAGNOSIS — Z1239 Encounter for other screening for malignant neoplasm of breast: Secondary | ICD-10-CM

## 2016-06-02 DIAGNOSIS — N939 Abnormal uterine and vaginal bleeding, unspecified: Secondary | ICD-10-CM

## 2016-06-02 DIAGNOSIS — Z01419 Encounter for gynecological examination (general) (routine) without abnormal findings: Secondary | ICD-10-CM

## 2016-06-02 DIAGNOSIS — Z9889 Other specified postprocedural states: Secondary | ICD-10-CM | POA: Diagnosis not present

## 2016-06-02 DIAGNOSIS — Z1231 Encounter for screening mammogram for malignant neoplasm of breast: Secondary | ICD-10-CM

## 2016-06-02 DIAGNOSIS — Z3042 Encounter for surveillance of injectable contraceptive: Secondary | ICD-10-CM | POA: Diagnosis not present

## 2016-06-02 MED ORDER — ESTRADIOL 1 MG PO TABS: 1.0000 mg | ORAL_TABLET | Freq: Every day | ORAL | 11 refills | Status: DC

## 2016-06-02 MED ORDER — MEDROXYPROGESTERONE ACETATE 150 MG/ML IM SUSP
150.0000 mg | Freq: Once | INTRAMUSCULAR | Status: AC
  Administered 2016-06-02: 150 mg via INTRAMUSCULAR

## 2016-06-02 MED ORDER — MEDROXYPROGESTERONE ACETATE 150 MG/ML IM SUSP: 150.0000 mg | INTRAMUSCULAR | 3 refills | Status: DC

## 2016-06-02 NOTE — Patient Instructions (Signed)
Preventive Care for Adults, Female A healthy lifestyle and preventive care can promote health and wellness. Preventive health guidelines for women include the following key practices.  A routine yearly physical is a good way to check with your health care provider about your health and preventive screening. It is a chance to share any concerns and updates on your health and to receive a thorough exam.  Visit your dentist for a routine exam and preventive care every 6 months. Brush your teeth twice a day and floss once a day. Good oral hygiene prevents tooth decay and gum disease.  The frequency of eye exams is based on your age, health, family medical history, use of contact lenses, and other factors. Follow your health care provider's recommendations for frequency of eye exams.  Eat a healthy diet. Foods like vegetables, fruits, whole grains, low-fat dairy products, and lean protein foods contain the nutrients you need without too many calories. Decrease your intake of foods high in solid fats, added sugars, and salt. Eat the right amount of calories for you.Get information about a proper diet from your health care provider, if necessary.  Regular physical exercise is one of the most important things you can do for your health. Most adults should get at least 150 minutes of moderate-intensity exercise (any activity that increases your heart rate and causes you to sweat) each week. In addition, most adults need muscle-strengthening exercises on 2 or more days a week.  Maintain a healthy weight. The body mass index (BMI) is a screening tool to identify possible weight problems. It provides an estimate of body fat based on height and weight. Your health care provider can find your BMI and can help you achieve or maintain a healthy weight.For adults 20 years and older:  A BMI below 18.5 is considered underweight.  A BMI of 18.5 to 24.9 is normal.  A BMI of 25 to 29.9 is considered  overweight.  A BMI of 30 and above is considered obese.  Maintain normal blood lipids and cholesterol levels by exercising and minimizing your intake of saturated fat. Eat a balanced diet with plenty of fruit and vegetables. Blood tests for lipids and cholesterol should begin at age 64 and be repeated every 5 years. If your lipid or cholesterol levels are high, you are over 50, or you are at high risk for heart disease, you may need your cholesterol levels checked more frequently.Ongoing high lipid and cholesterol levels should be treated with medicines if diet and exercise are not working.  If you smoke, find out from your health care provider how to quit. If you do not use tobacco, do not start.  Lung cancer screening is recommended for adults aged 52-80 years who are at high risk for developing lung cancer because of a history of smoking. A yearly low-dose CT scan of the lungs is recommended for people who have at least a 30-pack-year history of smoking and are a current smoker or have quit within the past 15 years. A pack year of smoking is smoking an average of 1 pack of cigarettes a day for 1 year (for example: 1 pack a day for 30 years or 2 packs a day for 15 years). Yearly screening should continue until the smoker has stopped smoking for at least 15 years. Yearly screening should be stopped for people who develop a health problem that would prevent them from having lung cancer treatment.  If you are pregnant, do not drink alcohol. If you are  breastfeeding, be very cautious about drinking alcohol. If you are not pregnant and choose to drink alcohol, do not have more than 1 drink per day. One drink is considered to be 12 ounces (355 mL) of beer, 5 ounces (148 mL) of wine, or 1.5 ounces (44 mL) of liquor.  Avoid use of street drugs. Do not share needles with anyone. Ask for help if you need support or instructions about stopping the use of drugs.  High blood pressure causes heart disease and  increases the risk of stroke. Your blood pressure should be checked at least every 1 to 2 years. Ongoing high blood pressure should be treated with medicines if weight loss and exercise do not work.  If you are 25-78 years old, ask your health care provider if you should take aspirin to prevent strokes.  Diabetes screening is done by taking a blood sample to check your blood glucose level after you have not eaten for a certain period of time (fasting). If you are not overweight and you do not have risk factors for diabetes, you should be screened once every 3 years starting at age 86. If you are overweight or obese and you are 3-87 years of age, you should be screened for diabetes every year as part of your cardiovascular risk assessment.  Breast cancer screening is essential preventive care for women. You should practice "breast self-awareness." This means understanding the normal appearance and feel of your breasts and may include breast self-examination. Any changes detected, no matter how small, should be reported to a health care provider. Women in their 66s and 30s should have a clinical breast exam (CBE) by a health care provider as part of a regular health exam every 1 to 3 years. After age 43, women should have a CBE every year. Starting at age 37, women should consider having a mammogram (breast X-ray test) every year. Women who have a family history of breast cancer should talk to their health care provider about genetic screening. Women at a high risk of breast cancer should talk to their health care providers about having an MRI and a mammogram every year.  Breast cancer gene (BRCA)-related cancer risk assessment is recommended for women who have family members with BRCA-related cancers. BRCA-related cancers include breast, ovarian, tubal, and peritoneal cancers. Having family members with these cancers may be associated with an increased risk for harmful changes (mutations) in the breast  cancer genes BRCA1 and BRCA2. Results of the assessment will determine the need for genetic counseling and BRCA1 and BRCA2 testing.  Your health care provider may recommend that you be screened regularly for cancer of the pelvic organs (ovaries, uterus, and vagina). This screening involves a pelvic examination, including checking for microscopic changes to the surface of your cervix (Pap test). You may be encouraged to have this screening done every 3 years, beginning at age 78.  For women ages 79-65, health care providers may recommend pelvic exams and Pap testing every 3 years, or they may recommend the Pap and pelvic exam, combined with testing for human papilloma virus (HPV), every 5 years. Some types of HPV increase your risk of cervical cancer. Testing for HPV may also be done on women of any age with unclear Pap test results.  Other health care providers may not recommend any screening for nonpregnant women who are considered low risk for pelvic cancer and who do not have symptoms. Ask your health care provider if a screening pelvic exam is right for  you.  If you have had past treatment for cervical cancer or a condition that could lead to cancer, you need Pap tests and screening for cancer for at least 20 years after your treatment. If Pap tests have been discontinued, your risk factors (such as having a new sexual partner) need to be reassessed to determine if screening should resume. Some women have medical problems that increase the chance of getting cervical cancer. In these cases, your health care provider may recommend more frequent screening and Pap tests.  Colorectal cancer can be detected and often prevented. Most routine colorectal cancer screening begins at the age of 50 years and continues through age 75 years. However, your health care provider may recommend screening at an earlier age if you have risk factors for colon cancer. On a yearly basis, your health care provider may provide  home test kits to check for hidden blood in the stool. Use of a small camera at the end of a tube, to directly examine the colon (sigmoidoscopy or colonoscopy), can detect the earliest forms of colorectal cancer. Talk to your health care provider about this at age 50, when routine screening begins. Direct exam of the colon should be repeated every 5-10 years through age 75 years, unless early forms of precancerous polyps or small growths are found.  People who are at an increased risk for hepatitis B should be screened for this virus. You are considered at high risk for hepatitis B if:  You were born in a country where hepatitis B occurs often. Talk with your health care provider about which countries are considered high risk.  Your parents were born in a high-risk country and you have not received a shot to protect against hepatitis B (hepatitis B vaccine).  You have HIV or AIDS.  You use needles to inject street drugs.  You live with, or have sex with, someone who has hepatitis B.  You get hemodialysis treatment.  You take certain medicines for conditions like cancer, organ transplantation, and autoimmune conditions.  Hepatitis C blood testing is recommended for all people born from 1945 through 1965 and any individual with known risks for hepatitis C.  Practice safe sex. Use condoms and avoid high-risk sexual practices to reduce the spread of sexually transmitted infections (STIs). STIs include gonorrhea, chlamydia, syphilis, trichomonas, herpes, HPV, and human immunodeficiency virus (HIV). Herpes, HIV, and HPV are viral illnesses that have no cure. They can result in disability, cancer, and death.  You should be screened for sexually transmitted illnesses (STIs) including gonorrhea and chlamydia if:  You are sexually active and are younger than 24 years.  You are older than 24 years and your health care provider tells you that you are at risk for this type of infection.  Your sexual  activity has changed since you were last screened and you are at an increased risk for chlamydia or gonorrhea. Ask your health care provider if you are at risk.  If you are at risk of being infected with HIV, it is recommended that you take a prescription medicine daily to prevent HIV infection. This is called preexposure prophylaxis (PrEP). You are considered at risk if:  You are sexually active and do not regularly use condoms or know the HIV status of your partner(s).  You take drugs by injection.  You are sexually active with a partner who has HIV.  Talk with your health care provider about whether you are at high risk of being infected with HIV. If   you choose to begin PrEP, you should first be tested for HIV. You should then be tested every 3 months for as long as you are taking PrEP.  Osteoporosis is a disease in which the bones lose minerals and strength with aging. This can result in serious bone fractures or breaks. The risk of osteoporosis can be identified using a bone density scan. Women ages 1 years and over and women at risk for fractures or osteoporosis should discuss screening with their health care providers. Ask your health care provider whether you should take a calcium supplement or vitamin D to reduce the rate of osteoporosis.  Menopause can be associated with physical symptoms and risks. Hormone replacement therapy is available to decrease symptoms and risks. You should talk to your health care provider about whether hormone replacement therapy is right for you.  Use sunscreen. Apply sunscreen liberally and repeatedly throughout the day. You should seek shade when your shadow is shorter than you. Protect yourself by wearing long sleeves, pants, a wide-brimmed hat, and sunglasses year round, whenever you are outdoors.  Once a month, do a whole body skin exam, using a mirror to look at the skin on your back. Tell your health care provider of new moles, moles that have irregular  borders, moles that are larger than a pencil eraser, or moles that have changed in shape or color.  Stay current with required vaccines (immunizations).  Influenza vaccine. All adults should be immunized every year.  Tetanus, diphtheria, and acellular pertussis (Td, Tdap) vaccine. Pregnant women should receive 1 dose of Tdap vaccine during each pregnancy. The dose should be obtained regardless of the length of time since the last dose. Immunization is preferred during the 27th-36th week of gestation. An adult who has not previously received Tdap or who does not know her vaccine status should receive 1 dose of Tdap. This initial dose should be followed by tetanus and diphtheria toxoids (Td) booster doses every 10 years. Adults with an unknown or incomplete history of completing a 3-dose immunization series with Td-containing vaccines should begin or complete a primary immunization series including a Tdap dose. Adults should receive a Td booster every 10 years.  Varicella vaccine. An adult without evidence of immunity to varicella should receive 2 doses or a second dose if she has previously received 1 dose. Pregnant females who do not have evidence of immunity should receive the first dose after pregnancy. This first dose should be obtained before leaving the health care facility. The second dose should be obtained 4-8 weeks after the first dose.  Human papillomavirus (HPV) vaccine. Females aged 13-26 years who have not received the vaccine previously should obtain the 3-dose series. The vaccine is not recommended for use in pregnant females. However, pregnancy testing is not needed before receiving a dose. If a female is found to be pregnant after receiving a dose, no treatment is needed. In that case, the remaining doses should be delayed until after the pregnancy. Immunization is recommended for any person with an immunocompromised condition through the age of 24 years if she did not get any or all doses  earlier. During the 3-dose series, the second dose should be obtained 4-8 weeks after the first dose. The third dose should be obtained 24 weeks after the first dose and 16 weeks after the second dose.  Zoster vaccine. One dose is recommended for adults aged 97 years or older unless certain conditions are present.  Measles, mumps, and rubella (MMR) vaccine. Adults born  before 1957 generally are considered immune to measles and mumps. Adults born in [birth year] or later should have 1 or more doses of MMR vaccine unless there is a contraindication to the vaccine or there is laboratory evidence of immunity to each of the three diseases. A routine second dose of MMR vaccine should be obtained at least 28 days after the first dose for students attending postsecondary schools, health care workers, or international travelers. People who received inactivated measles vaccine or an unknown type of measles vaccine during 1963-1967 should receive 2 doses of MMR vaccine. People who received inactivated mumps vaccine or an unknown type of mumps vaccine before 1979 and are at high risk for mumps infection should consider immunization with 2 doses of MMR vaccine. For females of childbearing age, rubella immunity should be determined. If there is no evidence of immunity, females who are not pregnant should be vaccinated. If there is no evidence of immunity, females who are pregnant should delay immunization until after pregnancy. Unvaccinated health care workers born before 60 who lack laboratory evidence of measles, mumps, or rubella immunity or laboratory confirmation of disease should consider measles and mumps immunization with 2 doses of MMR vaccine or rubella immunization with 1 dose of MMR vaccine.  Pneumococcal 13-valent conjugate (PCV13) vaccine. When indicated, a person who is uncertain of his immunization history and has no record of immunization should receive the PCV13 vaccine. All adults 61 years of age and older  should receive this vaccine. An adult aged 92 years or older who has certain medical conditions and has not been previously immunized should receive 1 dose of PCV13 vaccine. This PCV13 should be followed with a dose of pneumococcal polysaccharide (PPSV23) vaccine. Adults who are at high risk for pneumococcal disease should obtain the PPSV23 vaccine at least 8 weeks after the dose of PCV13 vaccine. Adults older than 43 years of age who have normal immune system function should obtain the PPSV23 vaccine dose at least 1 year after the dose of PCV13 vaccine.  Pneumococcal polysaccharide (PPSV23) vaccine. When PCV13 is also indicated, PCV13 should be obtained first. All adults aged 2 years and older should be immunized. An adult younger than age 30 years who has certain medical conditions should be immunized. Any person who resides in a nursing home or long-term care facility should be immunized. An adult smoker should be immunized. People with an immunocompromised condition and certain other conditions should receive both PCV13 and PPSV23 vaccines. People with human immunodeficiency virus (HIV) infection should be immunized as soon as possible after diagnosis. Immunization during chemotherapy or radiation therapy should be avoided. Routine use of PPSV23 vaccine is not recommended for American Indians, [place] Natives, or people younger than 65 years unless there are medical conditions that require PPSV23 vaccine. When indicated, people who have unknown immunization and have no record of immunization should receive PPSV23 vaccine. One-time revaccination 5 years after the first dose of PPSV23 is recommended for people aged 19-64 years who have chronic kidney failure, nephrotic syndrome, asplenia, or immunocompromised conditions. People who received 1-2 doses of PPSV23 before age 44 years should receive another dose of PPSV23 vaccine at age 83 years or later if at least 5 years have passed since the previous dose. Doses  of PPSV23 are not needed for people immunized with PPSV23 at or after age 20 years.  Meningococcal vaccine. Adults with asplenia or persistent complement component deficiencies should receive 2 doses of quadrivalent meningococcal conjugate (MenACWY-D) vaccine. The doses should be obtained  at least 2 months apart. Microbiologists working with certain meningococcal bacteria, [place] recruits, people at risk during an outbreak, and people who travel to or live in countries with a high rate of meningitis should be immunized. A first-year college student up through age 28 years who is living in a residence hall should receive a dose if she did not receive a dose on or after her 16th birthday. Adults who have certain high-risk conditions should receive one or more doses of vaccine.  Hepatitis A vaccine. Adults who wish to be protected from this disease, have certain high-risk conditions, work with hepatitis A-infected animals, work in hepatitis A research labs, or travel to or work in countries with a high rate of hepatitis A should be immunized. Adults who were previously unvaccinated and who anticipate close contact with an international adoptee during the first 60 days after arrival in the [place] from a country with a high rate of hepatitis A should be immunized.  Hepatitis B vaccine. Adults who wish to be protected from this disease, have certain high-risk conditions, may be exposed to blood or other infectious body fluids, are household contacts or sex partners of hepatitis B positive people, are clients or workers in certain care facilities, or travel to or work in countries with a high rate of hepatitis B should be immunized.  Haemophilus influenzae type b (Hib) vaccine. A previously unvaccinated person with asplenia or sickle cell disease or having a scheduled splenectomy should receive 1 dose of Hib vaccine. Regardless of previous immunization, a recipient of a hematopoietic stem cell transplant  should receive a 3-dose series 6-12 months after her successful transplant. Hib vaccine is not recommended for adults with HIV infection. Preventive Services / Frequency Ages 71 to 87 years  Blood pressure check.** / Every 3-5 years.  Lipid and cholesterol check.** / Every 5 years beginning at age 1.  Clinical breast exam.** / Every 3 years for women in their 3s and 31s.  BRCA-related cancer risk assessment.** / For women who have family members with a BRCA-related cancer (breast, ovarian, tubal, or peritoneal cancers).  Pap test.** / Every 2 years from ages 50 through 86. Every 3 years starting at age 87 through age 7 or 75 with a history of 3 consecutive normal Pap tests.  HPV screening.** / Every 3 years from ages 59 through ages 35 to 6 with a history of 3 consecutive normal Pap tests.  Hepatitis C blood test.** / For any individual with known risks for hepatitis C.  Skin self-exam. / Monthly.  Influenza vaccine. / Every year.  Tetanus, diphtheria, and acellular pertussis (Tdap, Td) vaccine.** / Consult your health care provider. Pregnant women should receive 1 dose of Tdap vaccine during each pregnancy. 1 dose of Td every 10 years.  Varicella vaccine.** / Consult your health care provider. Pregnant females who do not have evidence of immunity should receive the first dose after pregnancy.  HPV vaccine. / 3 doses over 6 months, if 72 and younger. The vaccine is not recommended for use in pregnant females. However, pregnancy testing is not needed before receiving a dose.  Measles, mumps, rubella (MMR) vaccine.** / You need at least 1 dose of MMR if you were born in 1957 or later. You may also need a 2nd dose. For females of childbearing age, rubella immunity should be determined. If there is no evidence of immunity, females who are not pregnant should be vaccinated. If there is no evidence of immunity, females who are  pregnant should delay immunization until after  pregnancy.  Pneumococcal 13-valent conjugate (PCV13) vaccine.** / Consult your health care provider.  Pneumococcal polysaccharide (PPSV23) vaccine.** / 1 to 2 doses if you smoke cigarettes or if you have certain conditions.  Meningococcal vaccine.** / 1 dose if you are age 87 to 44 years and a Market researcher living in a residence hall, or have one of several medical conditions, you need to get vaccinated against meningococcal disease. You may also need additional booster doses.  Hepatitis A vaccine.** / Consult your health care provider.  Hepatitis B vaccine.** / Consult your health care provider.  Haemophilus influenzae type b (Hib) vaccine.** / Consult your health care provider. Ages 86 to 38 years  Blood pressure check.** / Every year.  Lipid and cholesterol check.** / Every 5 years beginning at age 49 years.  Lung cancer screening. / Every year if you are aged 71-80 years and have a 30-pack-year history of smoking and currently smoke or have quit within the past 15 years. Yearly screening is stopped once you have quit smoking for at least 15 years or develop a health problem that would prevent you from having lung cancer treatment.  Clinical breast exam.** / Every year after age 51 years.  BRCA-related cancer risk assessment.** / For women who have family members with a BRCA-related cancer (breast, ovarian, tubal, or peritoneal cancers).  Mammogram.** / Every year beginning at age 18 years and continuing for as long as you are in good health. Consult with your health care provider.  Pap test.** / Every 3 years starting at age 63 years through age 37 or 57 years with a history of 3 consecutive normal Pap tests.  HPV screening.** / Every 3 years from ages 41 years through ages 76 to 23 years with a history of 3 consecutive normal Pap tests.  Fecal occult blood test (FOBT) of stool. / Every year beginning at age 36 years and continuing until age 51 years. You may not need  to do this test if you get a colonoscopy every 10 years.  Flexible sigmoidoscopy or colonoscopy.** / Every 5 years for a flexible sigmoidoscopy or every 10 years for a colonoscopy beginning at age 36 years and continuing until age 35 years.  Hepatitis C blood test.** / For all people born from [birth year] through 1963 and any individual with known risks for hepatitis C.  Skin self-exam. / Monthly.  Influenza vaccine. / Every year.  Tetanus, diphtheria, and acellular pertussis (Tdap/Td) vaccine.** / Consult your health care provider. Pregnant women should receive 1 dose of Tdap vaccine during each pregnancy. 1 dose of Td every 10 years.  Varicella vaccine.** / Consult your health care provider. Pregnant females who do not have evidence of immunity should receive the first dose after pregnancy.  Zoster vaccine.** / 1 dose for adults aged 73 years or older.  Measles, mumps, rubella (MMR) vaccine.** / You need at least 1 dose of MMR if you were born in 1957 or later. You may also need a second dose. For females of childbearing age, rubella immunity should be determined. If there is no evidence of immunity, females who are not pregnant should be vaccinated. If there is no evidence of immunity, females who are pregnant should delay immunization until after pregnancy.  Pneumococcal 13-valent conjugate (PCV13) vaccine.** / Consult your health care provider.  Pneumococcal polysaccharide (PPSV23) vaccine.** / 1 to 2 doses if you smoke cigarettes or if you have certain conditions.  Meningococcal vaccine.** /  Consult your health care provider.  Hepatitis A vaccine.** / Consult your health care provider.  Hepatitis B vaccine.** / Consult your health care provider.  Haemophilus influenzae type b (Hib) vaccine.** / Consult your health care provider. Ages 80 years and over  Blood pressure check.** / Every year.  Lipid and cholesterol check.** / Every 5 years beginning at age 62 years.  Lung cancer  screening. / Every year if you are aged 32-80 years and have a 30-pack-year history of smoking and currently smoke or have quit within the past 15 years. Yearly screening is stopped once you have quit smoking for at least 15 years or develop a health problem that would prevent you from having lung cancer treatment.  Clinical breast exam.** / Every year after age 61 years.  BRCA-related cancer risk assessment.** / For women who have family members with a BRCA-related cancer (breast, ovarian, tubal, or peritoneal cancers).  Mammogram.** / Every year beginning at age 39 years and continuing for as long as you are in good health. Consult with your health care provider.  Pap test.** / Every 3 years starting at age 85 years through age 74 or 72 years with 3 consecutive normal Pap tests. Testing can be stopped between 65 and 70 years with 3 consecutive normal Pap tests and no abnormal Pap or HPV tests in the past 10 years.  HPV screening.** / Every 3 years from ages 55 years through ages 67 or 77 years with a history of 3 consecutive normal Pap tests. Testing can be stopped between 65 and 70 years with 3 consecutive normal Pap tests and no abnormal Pap or HPV tests in the past 10 years.  Fecal occult blood test (FOBT) of stool. / Every year beginning at age 81 years and continuing until age 22 years. You may not need to do this test if you get a colonoscopy every 10 years.  Flexible sigmoidoscopy or colonoscopy.** / Every 5 years for a flexible sigmoidoscopy or every 10 years for a colonoscopy beginning at age 67 years and continuing until age 22 years.  Hepatitis C blood test.** / For all people born from [birth year] through 1963 and any individual with known risks for hepatitis C.  Osteoporosis screening.** / A one-time screening for women ages 8 years and over and women at risk for fractures or osteoporosis.  Skin self-exam. / Monthly.  Influenza vaccine. / Every year.  Tetanus, diphtheria, and  acellular pertussis (Tdap/Td) vaccine.** / 1 dose of Td every 10 years.  Varicella vaccine.** / Consult your health care provider.  Zoster vaccine.** / 1 dose for adults aged 56 years or older.  Pneumococcal 13-valent conjugate (PCV13) vaccine.** / Consult your health care provider.  Pneumococcal polysaccharide (PPSV23) vaccine.** / 1 dose for all adults aged 15 years and older.  Meningococcal vaccine.** / Consult your health care provider.  Hepatitis A vaccine.** / Consult your health care provider.  Hepatitis B vaccine.** / Consult your health care provider.  Haemophilus influenzae type b (Hib) vaccine.** / Consult your health care provider. ** Family history and personal history of risk and conditions may change your health care provider's recommendations.   This information is not intended to replace advice given to you by your health care provider. Make sure you discuss any questions you have with your health care provider.   Document Released: 09/22/2001 Document Revised: 08/17/2014 Document Reviewed: 12/22/2010 Elsevier Interactive Patient Education [employer].

## 2016-06-02 NOTE — Progress Notes (Addendum)
GYNECOLOGY ANNUAL PHYSICAL EXAM PROGRESS NOTE  Subjective:    Mariah Brandt is a 45 y.o. G69P1001  married female who presents for an annual exam. The patient is sexually active. The patient wears seatbelts: yes. The patient participates in regular exercise: no. Has the patient ever been transfused or tattooed?: no. The patient reports that there is not domestic violence in her life.   The patient has the following complaints today:  1) the patient complains of almost daily light bleeding to spotting over the past 6-7 months despite having an endometrial ablation performed last year.  Gynecologic History  Menarche age: 79 Patient's last menstrual period was 06/02/2016. Contraception: Depo-Provera injections History of STI's: Denies Last Pap: 02/2015. Results were: normal.  Denies h/o abnormal pap smears. Last mammogram: 2015. Results were: normal   Obstetric History   G1   P1   T1   P0   A0   L1    SAB0   TAB0   Ectopic0   Multiple0   Live Births1     # Outcome Date GA Lbr Len/2nd Weight Sex Delivery Anes PTL Lv  1 Term 1995 [redacted]w[redacted]d 6 lb 15 oz (3.147 kg) M Vag-Spont   LIV      Past Medical History:  Diagnosis Date  . Anxiety   . Constipation, chronic 1996   after chloecystetomy  . Diabetes mellitus without complication ([place]   . Dysmenorrhea   . Essential hypertension 06/09/2015  . Menorrhagia   . Obesity (BMI 30-39.9)     Past Surgical History:  Procedure Laterality Date  . CHOLECYSTECTOMY  1996  . DILITATION & CURRETTAGE/HYSTROSCOPY WITH NOVASURE ABLATION N/A 04/29/2015   Procedure: DILATATION & CURETTAGE/HYSTEROSCOPY WITH NOVASURE ABLATION;  Surgeon: ARubie Maid MD;  Location: [facility] ORS;  Service: Gynecology;  Laterality: N/A;    Family History  Problem Relation Age of Onset  . Hyperlipidemia Mother   . Fibroids Mother   . Thyroid disease Mother   . Heart disease Father   . Diabetes Paternal Aunt   . Diabetes Maternal Grandmother     Social  History   Social History  . Marital status: Married    Spouse name: N/A  . Number of children: N/A  . Years of education: N/A   Occupational History  . Not on file.   Social History Main Topics  . Smoking status: Never Smoker  . Smokeless tobacco: Never Used  . Alcohol use No     Comment: Pt drinks socially, maybe once a month  . Drug use: No  . Sexual activity: Yes    Birth control/ protection: , Injection   Other Topics Concern  . Not on file   Social History Narrative  . No narrative on file    Current Outpatient Prescriptions on File Prior to Visit  Medication Sig Dispense Refill  . citalopram (CELEXA) 10 MG tablet TAKE 1 TABLET BY MOUTH DAILY. 30 tablet 5  . glipiZIDE (GLUCOTROL) 5 MG tablet Take 0.5 tablets (2.5 mg total) by mouth 2 (two) times daily before a meal. 30 tablet 3  . glucose blood test strip Use as instructed 100 each 12  . Lancets (ONETOUCH ULTRASOFT) lancets Use as instructed to test blood sugars twice a day 100 each 12  . medroxyPROGESTERone (DEPO-PROVERA) 150 MG/ML injection Inject 1 mL (150 mg total) into the muscle every 3 (three) months. 1 mL 4  . spironolactone (ALDACTONE) 50 MG tablet Take 1 tablet (50 mg total) by mouth daily.  30 tablet 3   No current facility-administered medications on file prior to visit.     No Known Allergies   Review of Systems Constitutional: negative for chills, fatigue, fevers and sweats Eyes: negative for irritation, redness and visual disturbance Ears, nose, mouth, throat, and face: negative for hearing loss, nasal congestion, snoring and tinnitus Respiratory: negative for asthma, cough, sputum Cardiovascular: negative for chest pain, dyspnea, exertional chest pressure/discomfort, irregular heart beat, palpitations and syncope Gastrointestinal: negative for abdominal pain, change in bowel habits, nausea and vomiting Genitourinary: positive for abnormal menstrual periods (see HPI). Negative for genital lesions,  sexual problems and vaginal discharge, dysuria and urinary incontinence Integument/breast: negative for breast lump, breast tenderness and nipple discharge Hematologic/lymphatic: negative for bleeding and easy bruising Musculoskeletal:negative for back pain and muscle weakness Neurological: negative for dizziness, headaches, vertigo and weakness Endocrine: negative for diabetic symptoms including polydipsia, polyuria and skin dryness Allergic/Immunologic: negative for hay fever and urticaria      Objective:  Blood pressure 130/83, pulse 84, height 5' 1"  (1.549 m), weight 176 lb 4.8 oz (80 kg), last menstrual period 06/02/2016. Body mass index is 33.31 kg/m.  General Appearance:    Alert, cooperative, no distress, appears stated age, mildly obese  Head:    Normocephalic, without obvious abnormality, atraumatic  Eyes:    PERRL, conjunctiva/corneas clear, EOM's intact, both eyes  Ears:    Normal external ear canals, both ears  Nose:   Nares normal, septum midline, mucosa normal, no drainage or sinus tenderness  Throat:   Lips, mucosa, and tongue normal; teeth and gums normal  Neck:   Supple, symmetrical, trachea midline, no adenopathy; thyroid: no enlargement/tenderness/nodules; no carotid bruit or JVD  Back:     Symmetric, no curvature, ROM normal, no CVA tenderness  Lungs:     Clear to auscultation bilaterally, respirations unlabored  Chest Wall:    No tenderness or deformity   Heart:    Regular rate and rhythm, S1 and S2 normal, no murmur, rub or gallop  Breast Exam:    No tenderness, masses, or nipple abnormality  Abdomen:     Soft, non-tender, bowel sounds active all four quadrants, no masses, no organomegaly.    Genitalia:    Pelvic:external genitalia normal, vagina without lesions, discharge, or tenderness. Scant dark red blood in the vaginal vault.. Rectovaginal septum  normal. Cervix normal in appearance, no cervical motion tenderness, no adnexal masses or tenderness.  Uterus normal  size, shape, mobile, regular contours, nontender.  Rectal:    Normal external sphincter.  No hemorrhoids appreciated. Internal exam not done.   Extremities:   Extremities normal, atraumatic, no cyanosis or edema  Pulses:   2+ and symmetric all extremities  Skin:   Skin color, texture, turgor normal, no rashes or lesions  Lymph nodes:   Cervical, supraclavicular, and axillary nodes normal  Neurologic:   CNII-XII intact, normal strength, sensation and reflexes throughout   .  Labs:  Lab Results  Component Value Date   WBC 5.9 05/25/2016   HGB 13.6 05/25/2016   HCT 40.2 05/25/2016   MCV 90.5 05/25/2016   PLT 247.0 05/25/2016    Lab Results  Component Value Date   CREATININE 0.71 05/25/2016   BUN 9 05/25/2016   NA 142 05/25/2016   K 3.5 05/25/2016   CL 108 05/25/2016   CO2 28 05/25/2016    Lab Results  Component Value Date   ALT 13 05/25/2016   AST 16 05/25/2016   ALKPHOS 102 05/25/2016  BILITOT 0.9 05/25/2016    Lab Results  Component Value Date   TSH 1.410 03/12/2015    Lab Results  Component Value Date   CHOL 173 05/25/2016   HDL 46.50 05/25/2016   LDLCALC 100 (H) 05/25/2016   LDLDIRECT 113.0 05/25/2016   TRIG 132.0 05/25/2016   CHOLHDL 4 05/25/2016    Lab Results  Component Value Date   HGBA1C 6.8 (H) 05/25/2016     Assessment:    Healthy female exam.   Abnormal uterine bleeding Obesity Class I Diabetes  HTN  Plan:     Breast self exam technique reviewed and patient encouraged to perform self-exam monthly. Contraception: Depo-Provera injections. Discussed healthy lifestyle modifications. Mammogram ordered. Pap smear up to date.  Next pap smear due in 2019.  Discussed abnormal uterine bleeding, could be secondary to hypoestrogenism (patient currently using Depo-Provera for contraception), or may be secondary to previous history of abnormal uterine bleeding prior to endometrial ablation with a possible sub-therapeutic treatment response to the  ablation.  Other possibility could include structural defect within the uterus such as an endometrial polyp. Will attempt to treat with supplemental estrogen approximately 10 days out of the month for the next 2-3 months. If bleeding continues to persist, we'll order pelvic ultrasound. Administered Depo-Provera injection today.  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 Chronic hypertension and diabetes managed by primary care provider.  Return to clinic in one year for annual exam.   [name], MD [facility]

## 2016-06-08 ENCOUNTER — Encounter: Payer: Self-pay | Admitting: Obstetrics and Gynecology

## 2016-08-18 ENCOUNTER — Ambulatory Visit: Payer: 59

## 2016-08-21 ENCOUNTER — Ambulatory Visit (INDEPENDENT_AMBULATORY_CARE_PROVIDER_SITE_OTHER): Payer: 59 | Admitting: Obstetrics and Gynecology

## 2016-08-21 VITALS — BP 116/85 | HR 89 | Wt 179.0 lb

## 2016-08-21 DIAGNOSIS — Z3042 Encounter for surveillance of injectable contraceptive: Secondary | ICD-10-CM | POA: Diagnosis not present

## 2016-08-21 MED ORDER — MEDROXYPROGESTERONE ACETATE 150 MG/ML IM SUSP
150.0000 mg | Freq: Once | INTRAMUSCULAR | Status: AC
  Administered 2016-08-21: 150 mg via INTRAMUSCULAR

## 2016-08-21 NOTE — Progress Notes (Signed)
Patient ID: Mariah Brandt, female   DOB: Feb 18, 1971, 46 y.o.   MRN: [id number] Pt presents for depo-provera injection without any undesirable side effects.

## 2016-08-26 ENCOUNTER — Ambulatory Visit: Payer: Self-pay | Admitting: Internal Medicine

## 2016-08-26 ENCOUNTER — Ambulatory Visit: Payer: 59

## 2016-09-01 ENCOUNTER — Ambulatory Visit
Admission: RE | Admit: 2016-09-01 | Discharge: 2016-09-01 | Disposition: A | Discharge status: Home / Self Care | Payer: 59 | Source: Ambulatory Visit | Attending: Obstetrics and Gynecology | Admitting: Obstetrics and Gynecology

## 2016-09-01 DIAGNOSIS — Z1231 Encounter for screening mammogram for malignant neoplasm of breast: Secondary | ICD-10-CM | POA: Insufficient documentation

## 2016-09-01 DIAGNOSIS — Z1239 Encounter for other screening for malignant neoplasm of breast: Secondary | ICD-10-CM

## 2016-09-22 ENCOUNTER — Encounter: Payer: Self-pay | Admitting: Internal Medicine

## 2016-09-22 ENCOUNTER — Ambulatory Visit (INDEPENDENT_AMBULATORY_CARE_PROVIDER_SITE_OTHER): Payer: 59 | Admitting: Internal Medicine

## 2016-09-22 VITALS — BP 130/88 | HR 88 | Resp 16 | Wt 181.0 lb

## 2016-09-22 DIAGNOSIS — E119 Type 2 diabetes mellitus without complications: Secondary | ICD-10-CM

## 2016-09-22 DIAGNOSIS — E1159 Type 2 diabetes mellitus with other circulatory complications: Secondary | ICD-10-CM

## 2016-09-22 DIAGNOSIS — I10 Essential (primary) hypertension: Secondary | ICD-10-CM

## 2016-09-22 DIAGNOSIS — F411 Generalized anxiety disorder: Secondary | ICD-10-CM

## 2016-09-22 DIAGNOSIS — R635 Abnormal weight gain: Secondary | ICD-10-CM | POA: Diagnosis not present

## 2016-09-22 DIAGNOSIS — R5383 Other fatigue: Secondary | ICD-10-CM | POA: Diagnosis not present

## 2016-09-22 DIAGNOSIS — E669 Obesity, unspecified: Secondary | ICD-10-CM | POA: Diagnosis not present

## 2016-09-22 DIAGNOSIS — N921 Excessive and frequent menstruation with irregular cycle: Secondary | ICD-10-CM

## 2016-09-22 DIAGNOSIS — Z23 Encounter for immunization: Secondary | ICD-10-CM

## 2016-09-22 DIAGNOSIS — E1169 Type 2 diabetes mellitus with other specified complication: Secondary | ICD-10-CM

## 2016-09-22 MED ORDER — SPIRONOLACTONE 50 MG PO TABS: 50.0000 mg | ORAL_TABLET | Freq: Every day | ORAL | 1 refills | Status: DC

## 2016-09-22 MED ORDER — CITALOPRAM HYDROBROMIDE 10 MG PO TABS: 10.0000 mg | ORAL_TABLET | Freq: Every day | ORAL | 1 refills | Status: DC

## 2016-09-22 NOTE — Progress Notes (Signed)
Pre visit review using our clinic review tool, if applicable. No additional management support is needed unless otherwise documented below in the visit note. 

## 2016-09-22 NOTE — Progress Notes (Signed)
Subjective:  Patient ID: Mariah Brandt, female    DOB: 03-May-1971  Age: 46 y.o. MRN: [id number]  CC: The primary encounter diagnosis was Fatigue, unspecified type. Diagnoses of Weight gain, Obesity, diabetes, and hypertension syndrome ([place]), Essential hypertension, Need for pneumococcal vaccination, GAD (generalized anxiety disorder), and Menorrhagia with irregular cycle were also pertinent to this visit.  HPI [name] presents for FOLLOW UP  HAS GAINED 5 MORE POUNDS SINCE LAST VISIT  NOW GETTING ESTROGEN THERAPY 10 DAYS PER MONTH FROM GYN.  ENERGY LEVEL HAS IMPROVED,  BLEEDING HAS STOPPED. DIABETES    TOLERATING CELEXA?  YES,  LESS IRRITABLE.  SLEEPING OK 7-8 HOURS,  HAVING SOME NIGHT SWEATS WHICH WAKE HER UP. WORSE SINCE ADDING ESTRADIOL.  Lab Results  Component Value Date   TSH 1.39 09/22/2016    HAS JOINED [employer] AT WORK, FOR DIABETES MEAL AND ACTIVITY MONITORING,  USES AN APP MONITORED BY NURSE . CURRENT GOAL IS 2000 STEPS PER DAY    HAS NOT RECEIVED ANY FEEDBACK. yet   SUGARS ARE 117 FASTING,   LOWEST WAS 43 M HIGHEST WAS 230 AFTER EATING A BOJANGLES BISCUIT which she does on an average of EVERY 2 WEEKS  JUST ONCE. MOST OF THE TIME 140 .   Lab Results  Component Value Date   HGBA1C 7.0 (H) 09/22/2016   HAS BEEN MORE CONSTIPATED LATELY,  HAVING A SMALL MOVEMENT DAILY     Outpatient Medications Prior to Visit  Medication Sig Dispense Refill  . estradiol (ESTRACE) 1 MG tablet Take 1 tablet (1 mg total) by mouth daily. 10 tablet 11  . glipiZIDE (GLUCOTROL) 5 MG tablet Take 0.5 tablets (2.5 mg total) by mouth 2 (two) times daily before a meal. 30 tablet 3  . glucose blood test strip Use as instructed 100 each 12  . Lancets (ONETOUCH ULTRASOFT) lancets Use as instructed to test blood sugars twice a day 100 each 12  . medroxyPROGESTERone (DEPO-PROVERA) 150 MG/ML injection Inject 1 mL (150 mg total) into the muscle every 3 (three) months.  1 mL 3  . citalopram (CELEXA) 10 MG tablet TAKE 1 TABLET BY MOUTH DAILY. 30 tablet 5  . spironolactone (ALDACTONE) 50 MG tablet Take 1 tablet (50 mg total) by mouth daily. 30 tablet 3   No facility-administered medications prior to visit.     Review of Systems;  Patient denies headache, fevers, malaise, unintentional weight loss, skin rash, eye pain, sinus congestion and sinus pain, sore throat, dysphagia,  hemoptysis , cough, dyspnea, wheezing, chest pain, palpitations, orthopnea, edema, abdominal pain, nausea, melena, diarrhea, constipation, flank pain, dysuria, hematuria, urinary  Frequency, nocturia, numbness, tingling, seizures,  Focal weakness, Loss of consciousness,  Tremor, insomnia, depression, anxiety, and suicidal ideation.      Objective:  BP 130/88   Pulse 88   Resp 16   Wt 181 lb (82.1 kg)   SpO2 99%   BMI 34.20 kg/m   BP Readings from Last 3 Encounters:  09/22/16 130/88  08/21/16 116/85  06/02/16 130/83    Wt Readings from Last 3 Encounters:  09/22/16 181 lb (82.1 kg)  08/21/16 179 lb (81.2 kg)  06/02/16 176 lb 4.8 oz (80 kg)    General appearance: alert, cooperative and appears stated age Ears: normal TM's and external ear canals both ears Throat: lips, mucosa, and tongue normal; teeth and gums normal Neck: no adenopathy, no carotid bruit, supple, symmetrical, trachea midline and thyroid not enlarged, symmetric, no tenderness/mass/nodules Back: symmetric, no  curvature. ROM normal. No CVA tenderness. Lungs: clear to auscultation bilaterally Heart: regular rate and rhythm, S1, S2 normal, no murmur, click, rub or gallop Abdomen: soft, non-tender; bowel sounds normal; no masses,  no organomegaly Pulses: 2+ and symmetric Skin: Skin color, texture, turgor normal. No rashes or lesions Lymph nodes: Cervical, supraclavicular, and axillary nodes normal.  Lab Results  Component Value Date   HGBA1C 7.0 (H) 09/22/2016   HGBA1C 6.8 (H) 05/25/2016   HGBA1C 6.9 (H)  01/22/2016    Lab Results  Component Value Date   CREATININE 0.68 09/22/2016   CREATININE 0.71 05/25/2016   CREATININE 0.77 01/22/2016    Lab Results  Component Value Date   WBC 5.9 05/25/2016   HGB 13.6 05/25/2016   HCT 40.2 05/25/2016   PLT 247.0 05/25/2016   GLUCOSE 191 (H) 09/22/2016   CHOL 173 05/25/2016   TRIG 132.0 05/25/2016   HDL 46.50 05/25/2016   LDLDIRECT 127.0 09/22/2016   LDLCALC 100 (H) 05/25/2016   ALT 11 09/22/2016   AST 10 09/22/2016   NA 140 09/22/2016   K 4.0 09/22/2016   CL 107 09/22/2016   CREATININE 0.68 09/22/2016   BUN 9 09/22/2016   CO2 28 09/22/2016   TSH 1.39 09/22/2016   HGBA1C 7.0 (H) 09/22/2016   MICROALBUR 1.7 09/22/2016    Mm Screening Breast Tomo Bilateral  Result Date: 09/03/2016 CLINICAL DATA:  Screening. EXAM: 2D DIGITAL SCREENING BILATERAL MAMMOGRAM WITH CAD AND ADJUNCT TOMO COMPARISON:  Previous exam(s). ACR Breast Density Category b: There are scattered areas of fibroglandular density. FINDINGS: There are no findings suspicious for malignancy. Images were processed with CAD. IMPRESSION: No mammographic evidence of malignancy. A result letter of this screening mammogram will be mailed directly to the patient. RECOMMENDATION: Screening mammogram in one year. (Code:SM-B-01Y) BI-RADS CATEGORY  1: Negative. Electronically Signed   By: Pamelia Hoit M.D.   On: 09/03/2016 09:01    Assessment & Plan:   Problem List Items Addressed This Visit    Essential hypertension    Elevated today.  Patient has been asked to check bp at home and submit readings in 2 weeks.   Lab Results  Component Value Date   CREATININE 0.68 09/22/2016   Lab Results  Component Value Date   NA 140 09/22/2016   K 4.0 09/22/2016   CL 107 09/22/2016   CO2 28 09/22/2016         Relevant Medications   spironolactone (ALDACTONE) 50 MG tablet   Other Relevant Orders   Comprehensive metabolic panel (Completed)   Fatigue - Primary    Improved with addition of  estradiol      Relevant Orders   TSH (Completed)   Vitamin B12 (Completed)   GAD (generalized anxiety disorder)    Stressors identified by patient.  Continue citalopram at current dose per patient preference.  Advised to add daily exercise to manage stress.        Menorrhagia with irregular cycle    Likely secondary to PCOS.  Started on DepoProveraestradiol       Obesity, diabetes, and hypertension syndrome ([place])    She is tolerating glipizide with good reduction in a1c to < 7.0 . no changes today. Strongly encouraged to increase her activity to  30 minutes walks on treadmill . , with a goal of 5/week.  Weight loss encouraged.   Lab Results  Component Value Date   HGBA1C 7.0 (H) 09/22/2016   Lab Results  Component Value Date   MICROALBUR 1.7  09/22/2016         Relevant Orders   Hemoglobin A1c (Completed)   LDL cholesterol, direct (Completed)   Microalbumin / creatinine urine ratio (Completed)    Other Visit Diagnoses    Weight gain       Need for pneumococcal vaccination       Relevant Orders   Pneumococcal polysaccharide vaccine 23-valent greater than or equal to 2yo subcutaneous/IM (Completed)      I have changed Ms. Khiev-Hendrix's citalopram. I am also having her maintain her glucose blood, onetouch ultrasoft, glipiZIDE, estradiol, medroxyPROGESTERone, and spironolactone.  Meds ordered this encounter  Medications  . spironolactone (ALDACTONE) 50 MG tablet    Sig: Take 1 tablet (50 mg total) by mouth daily.    Dispense:  90 tablet    Refill:  1  . citalopram (CELEXA) 10 MG tablet    Sig: Take 1 tablet (10 mg total) by mouth daily.    Dispense:  90 tablet    Refill:  1    Keep on file for next refill    Medications Discontinued During This Encounter  Medication Reason  . spironolactone (ALDACTONE) 50 MG tablet Reorder  . citalopram (CELEXA) 10 MG tablet Reorder    Follow-up: Return in about 6 months (around 03/22/2017) for follow up  diabetes.   Crecencio Mc, MD

## 2016-09-22 NOTE — Patient Instructions (Addendum)
CK  YOUR BLOOD SUGAR 2 HOURS AFTER EATING YOUR CEREAL   Your received the pneumonia vaccine today  Goal BP is 120/70,  Let me know if next reading is high   See you in 6 months unless a1c is > 7.0 today   compare the heritage flakes cereal  To special K

## 2016-09-23 LAB — COMPREHENSIVE METABOLIC PANEL
ALT: 11 U/L (ref 0–35)
AST: 10 U/L (ref 0–37)
Albumin: 4.5 g/dL (ref 3.5–5.2)
Alkaline Phosphatase: 116 U/L (ref 39–117)
BILIRUBIN TOTAL: 0.5 mg/dL (ref 0.2–1.2)
BUN: 9 mg/dL (ref 6–23)
CHLORIDE: 107 meq/L (ref 96–112)
CO2: 28 meq/L (ref 19–32)
CREATININE: 0.68 mg/dL (ref 0.40–1.20)
Calcium: 9.7 mg/dL (ref 8.4–10.5)
GFR: 121.04 mL/min (ref >60.00)
GLUCOSE: 191 mg/dL — AB (ref 70–99)
Potassium: 4 mEq/L (ref 3.5–5.1)
SODIUM: 140 meq/L (ref 135–145)
Total Protein: 7.5 g/dL (ref 6.0–8.3)

## 2016-09-23 LAB — LDL CHOLESTEROL, DIRECT: Direct LDL: 127 mg/dL

## 2016-09-23 LAB — HEMOGLOBIN A1C: Hgb A1c MFr Bld: 7 % — ABNORMAL HIGH (ref 4.6–6.5)

## 2016-09-23 LAB — MICROALBUMIN / CREATININE URINE RATIO
Creatinine,U: 180 mg/dL
MICROALB/CREAT RATIO: 0.9 mg/g (ref 0.0–30.0)
Microalb, Ur: 1.7 mg/dL (ref 0.0–1.9)

## 2016-09-23 LAB — VITAMIN B12: VITAMIN B 12: 323 pg/mL (ref 211–911)

## 2016-09-23 LAB — TSH: TSH: 1.39 u[IU]/mL (ref 0.35–4.50)

## 2016-09-24 DIAGNOSIS — R5383 Other fatigue: Secondary | ICD-10-CM | POA: Insufficient documentation

## 2016-09-24 NOTE — Assessment & Plan Note (Addendum)
Stressors identified by patient.  Continue citalopram at current dose per patient preference.  Advised to add daily exercise to manage stress.

## 2016-09-24 NOTE — Assessment & Plan Note (Signed)
She is tolerating glipizide with good reduction in a1c to < 7.0 . no changes today. Strongly encouraged to increase her activity to  30 minutes walks on treadmill . , with a goal of 5/week.  Weight loss encouraged.   Lab Results  Component Value Date   HGBA1C 7.0 (H) 09/22/2016   Lab Results  Component Value Date   MICROALBUR 1.7 09/22/2016

## 2016-09-24 NOTE — Assessment & Plan Note (Signed)
Elevated today.  Patient has been asked to check bp at home and submit readings in 2 weeks.   Lab Results  Component Value Date   CREATININE 0.68 09/22/2016   Lab Results  Component Value Date   NA 140 09/22/2016   K 4.0 09/22/2016   CL 107 09/22/2016   CO2 28 09/22/2016

## 2016-09-24 NOTE — Assessment & Plan Note (Signed)
Improved with addition of estradiol

## 2016-09-24 NOTE — Assessment & Plan Note (Signed)
Likely secondary to PCOS.  Started on DepoProveraestradiol

## 2016-09-28 ENCOUNTER — Encounter: Payer: Self-pay | Admitting: Internal Medicine

## 2016-10-01 ENCOUNTER — Other Ambulatory Visit: Payer: Self-pay | Admitting: Internal Medicine

## 2016-10-12 ENCOUNTER — Encounter: Payer: Self-pay | Admitting: Internal Medicine

## 2016-10-12 DIAGNOSIS — N611 Abscess of the breast and nipple: Secondary | ICD-10-CM

## 2016-10-13 DIAGNOSIS — N611 Abscess of the breast and nipple: Secondary | ICD-10-CM | POA: Insufficient documentation

## 2016-10-13 MED ORDER — DOXYCYCLINE HYCLATE 100 MG PO TABS: 100.0000 mg | ORAL_TABLET | Freq: Two times a day (BID) | ORAL | 0 refills | Status: DC

## 2016-11-06 ENCOUNTER — Ambulatory Visit: Payer: 59

## 2016-11-09 ENCOUNTER — Ambulatory Visit (INDEPENDENT_AMBULATORY_CARE_PROVIDER_SITE_OTHER): Payer: 59 | Admitting: Obstetrics and Gynecology

## 2016-11-09 VITALS — BP 117/77 | HR 87 | Wt 180.3 lb

## 2016-11-09 DIAGNOSIS — N921 Excessive and frequent menstruation with irregular cycle: Secondary | ICD-10-CM

## 2016-11-09 MED ORDER — NORETHINDRONE 0.35 MG PO TABS: 1.0000 | ORAL_TABLET | Freq: Every day | ORAL | 6 refills | Status: DC

## 2016-11-09 MED ORDER — NORETHINDRONE 0.35 MG PO TABS: 1.0000 | ORAL_TABLET | Freq: Every day | ORAL | 11 refills | Status: DC

## 2016-11-09 NOTE — Progress Notes (Signed)
Pt presented to office for depo inj. However she would like to stop depo due to breakthrough bleeding. She would like to start OCP. Per Dr Marcelline Mates- start camila ocp. Script escribed to pharmacy of choice.

## 2016-11-24 ENCOUNTER — Encounter: Payer: Self-pay | Admitting: Obstetrics and Gynecology

## 2016-11-25 ENCOUNTER — Other Ambulatory Visit: Payer: Self-pay

## 2016-11-25 DIAGNOSIS — N938 Other specified abnormal uterine and vaginal bleeding: Secondary | ICD-10-CM

## 2016-11-25 MED ORDER — NORETHIN ACE-ETH ESTRAD-FE 1-20 MG-MCG PO TABS: 1.0000 | ORAL_TABLET | Freq: Every day | ORAL | 6 refills | Status: DC

## 2016-11-25 NOTE — Telephone Encounter (Signed)
See my chart message

## 2017-01-06 DIAGNOSIS — H5212 Myopia, left eye: Secondary | ICD-10-CM | POA: Diagnosis not present

## 2017-01-06 DIAGNOSIS — H2513 Age-related nuclear cataract, bilateral: Secondary | ICD-10-CM | POA: Diagnosis not present

## 2017-01-06 DIAGNOSIS — H04123 Dry eye syndrome of bilateral lacrimal glands: Secondary | ICD-10-CM | POA: Diagnosis not present

## 2017-01-06 DIAGNOSIS — H52223 Regular astigmatism, bilateral: Secondary | ICD-10-CM | POA: Diagnosis not present

## 2017-01-06 DIAGNOSIS — H25013 Cortical age-related cataract, bilateral: Secondary | ICD-10-CM | POA: Diagnosis not present

## 2017-01-06 LAB — HM DIABETES EYE EXAM

## 2017-03-23 ENCOUNTER — Ambulatory Visit (INDEPENDENT_AMBULATORY_CARE_PROVIDER_SITE_OTHER): Payer: 59 | Admitting: Internal Medicine

## 2017-03-23 ENCOUNTER — Encounter: Payer: Self-pay | Admitting: Internal Medicine

## 2017-03-23 ENCOUNTER — Ambulatory Visit (INDEPENDENT_AMBULATORY_CARE_PROVIDER_SITE_OTHER): Payer: 59

## 2017-03-23 VITALS — BP 128/80 | HR 108 | Temp 99.2°F | Resp 16 | Ht 61.0 in | Wt 174.6 lb

## 2017-03-23 DIAGNOSIS — I10 Essential (primary) hypertension: Secondary | ICD-10-CM | POA: Diagnosis not present

## 2017-03-23 DIAGNOSIS — R748 Abnormal levels of other serum enzymes: Secondary | ICD-10-CM | POA: Diagnosis not present

## 2017-03-23 DIAGNOSIS — M25541 Pain in joints of right hand: Secondary | ICD-10-CM | POA: Diagnosis not present

## 2017-03-23 DIAGNOSIS — E1159 Type 2 diabetes mellitus with other circulatory complications: Secondary | ICD-10-CM

## 2017-03-23 DIAGNOSIS — E119 Type 2 diabetes mellitus without complications: Secondary | ICD-10-CM | POA: Diagnosis not present

## 2017-03-23 DIAGNOSIS — M130 Polyarthritis, unspecified: Secondary | ICD-10-CM

## 2017-03-23 DIAGNOSIS — M79641 Pain in right hand: Secondary | ICD-10-CM | POA: Diagnosis not present

## 2017-03-23 DIAGNOSIS — E669 Obesity, unspecified: Secondary | ICD-10-CM | POA: Diagnosis not present

## 2017-03-23 DIAGNOSIS — E1169 Type 2 diabetes mellitus with other specified complication: Secondary | ICD-10-CM

## 2017-03-23 LAB — COMPREHENSIVE METABOLIC PANEL
ALBUMIN: 4.2 g/dL (ref 3.5–5.2)
ALK PHOS: 82 U/L (ref 39–117)
ALT: 42 U/L — AB (ref 0–35)
AST: 70 U/L — AB (ref 0–37)
BILIRUBIN TOTAL: 0.5 mg/dL (ref 0.2–1.2)
BUN: 8 mg/dL (ref 6–23)
CALCIUM: 9.3 mg/dL (ref 8.4–10.5)
CO2: 25 meq/L (ref 19–32)
CREATININE: 0.62 mg/dL (ref 0.40–1.20)
Chloride: 106 mEq/L (ref 96–112)
GFR: 134.35 mL/min (ref >60.00)
Glucose, Bld: 123 mg/dL — ABNORMAL HIGH (ref 70–99)
Potassium: 3.6 mEq/L (ref 3.5–5.1)
Sodium: 137 mEq/L (ref 135–145)
TOTAL PROTEIN: 6.7 g/dL (ref 6.0–8.3)

## 2017-03-23 LAB — CBC WITH DIFFERENTIAL/PLATELET
BASOS PCT: 0.6 % (ref 0.0–3.0)
Basophils Absolute: 0 10*3/uL (ref 0.0–0.1)
EOS ABS: 0.1 10*3/uL (ref 0.0–0.7)
Eosinophils Relative: 1.4 % (ref 0.0–5.0)
HEMATOCRIT: 42.3 % (ref 36.0–46.0)
Hemoglobin: 13.7 g/dL (ref 12.0–15.0)
Lymphocytes Relative: 13.7 % (ref 12.0–46.0)
Lymphs Abs: 1 10*3/uL (ref 0.7–4.0)
MCHC: 32.5 g/dL (ref 30.0–36.0)
MCV: 92.6 fl (ref 78.0–100.0)
MONO ABS: 0.8 10*3/uL (ref 0.1–1.0)
Monocytes Relative: 10.7 % (ref 3.0–12.0)
NEUTROS ABS: 5.3 10*3/uL (ref 1.4–7.7)
Neutrophils Relative %: 73.6 % (ref 43.0–77.0)
PLATELETS: 233 10*3/uL (ref 150.0–400.0)
RBC: 4.57 Mil/uL (ref 3.87–5.11)
RDW: 14 % (ref 11.5–15.5)
WBC: 7.1 10*3/uL (ref 4.0–10.5)

## 2017-03-23 LAB — SEDIMENTATION RATE: SED RATE: 47 mm/h — AB (ref 0–20)

## 2017-03-23 LAB — POCT GLYCOSYLATED HEMOGLOBIN (HGB A1C): Hemoglobin A1C: 6.2

## 2017-03-23 LAB — URIC ACID: URIC ACID, SERUM: 3.4 mg/dL (ref 2.4–7.0)

## 2017-03-23 LAB — C-REACTIVE PROTEIN: CRP: 3.2 mg/dL (ref 0.5–20.0)

## 2017-03-23 MED ORDER — GLUCOSE BLOOD VI STRP: ORAL_STRIP | 12 refills | Status: DC

## 2017-03-23 NOTE — Progress Notes (Addendum)
Subjective:  Patient ID: Mariah Brandt, female    DOB: September 12, 1970  Age: 46 y.o. MRN: [id number]  CC: The primary encounter diagnosis was Pain in joint of right hand. Diagnoses of Obesity, diabetes, and hypertension syndrome ([facility]), Polyarthritis, Elevated liver enzymes, and Essential hypertension were also pertinent to this visit.  HPI [name] presents for follow up on obesity, Type 2 DM and GAD.  1) Obesity:  She has lost 6 lbs since April.   2) fatigue : feels tired 24/7 :  Up at 6:30 am,  in bed by 9 Pm.  told she snores.Marland Kitchen    3)  Diabetes:  Blood sugars have been very labile since starting the glipizide.  feels bad when sugars drops to 88 .  Occurring in the  afternoon .  Taking 2.5 mg in am and 2.5  In the eveningEnrolled in [place] since [place]  .  Has been having some trouble getting the app on her I phone to convey cbgs to the nurse running the program.   4) Joint pain:  Mother diagnosed with RA recently as well as her  uncle.  Patient reports waking up with stiff hands,  Has noticed that her hand joints are swollen and she has PIP joint pain in both hands,  r > L  AND BILATERAL INTERMITTENT  foot pain involving the TOES.    Lab Results  Component Value Date   HGBA1C 6.2 03/23/2017      Outpatient Medications Prior to Visit  Medication Sig Dispense Refill  . citalopram (CELEXA) 10 MG tablet Take 1 tablet (10 mg total) by mouth daily. 90 tablet 1  . glipiZIDE (GLUCOTROL) 5 MG tablet TAKE 0.5 TABLETS BY MOUTH 2 TIMES DAILY BEFORE A MEAL. 30 tablet 3  . Lancets (ONETOUCH ULTRASOFT) lancets Use as instructed to test blood sugars twice a day 100 each 12  . norethindrone-ethinyl estradiol (JUNEL FE 1/20) 1-20 MG-MCG tablet Take 1 tablet by mouth daily. 2 Package 6  . spironolactone (ALDACTONE) 50 MG tablet Take 1 tablet (50 mg total) by mouth daily. 90 tablet 1  . glucose blood test strip Use as instructed 100 each 12  . doxycycline (VIBRA-TABS) 100  MG tablet Take 1 tablet (100 mg total) by mouth 2 (two) times daily. (Patient not taking: Reported on 03/23/2017) 14 tablet 0  . estradiol (ESTRACE) 1 MG tablet Take 1 tablet (1 mg total) by mouth daily. (Patient not taking: Reported on 03/23/2017) 10 tablet 11   No facility-administered medications prior to visit.     Review of Systems;  Patient denies headache, fevers, malaise, unintentional weight loss, skin rash, eye pain, sinus congestion and sinus pain, sore throat, dysphagia,  hemoptysis , cough, dyspnea, wheezing, chest pain, palpitations, orthopnea, edema, abdominal pain, nausea, melena, diarrhea, constipation, flank pain, dysuria, hematuria, urinary  Frequency, nocturia, numbness, tingling, seizures,  Focal weakness, Loss of consciousness,  Tremor, insomnia, depression, anxiety, and suicidal ideation.      Objective:  BP 128/80 (BP Location: Left Arm, Patient Position: Sitting, Cuff Size: Large)   Pulse (!) 108   Temp 99.2 F (37.3 C) (Oral)   Resp 16   Ht _0  (1.549 m)   Wt 174 lb 9.6 oz (79.2 kg)   SpO2 98%   BMI 32.99 kg/m   BP Readings from Last 3 Encounters:  03/23/17 128/80  11/09/16 117/77  09/22/16 130/88    Wt Readings from Last 3 Encounters:  03/23/17 174 lb 9.6 oz (79.2 kg)  11/09/16 180 lb 5 oz (81.8 kg)  09/22/16 181 lb (82.1 kg)    General appearance: alert, cooperative and appears stated age Ears: normal TM's and external ear canals both ears Throat: lips, mucosa, and tongue normal; teeth and gums normal Neck: no adenopathy, no carotid bruit, supple, symmetrical, trachea midline and thyroid not enlarged, symmetric, no tenderness/mass/nodules Back: symmetric, no curvature. ROM normal. No CVA tenderness. Lungs: clear to auscultation bilaterally Heart: regular rate and rhythm, S1, S2 normal, no murmur, click, rub or gallop Abdomen: soft, non-tender; bowel sounds normal; no masses,  no organomegaly Pulses: 2+ and symmetric Skin: Skin color, texture,  turgor normal. No rashes or lesions Lymph nodes: Cervical, supraclavicular, and axillary nodes normal. MSK: synovitis involving hands and feet.  Sausage shaped digits.   Lab Results  Component Value Date   HGBA1C 6.2 03/23/2017   HGBA1C 7.0 (H) 09/22/2016   HGBA1C 6.8 (H) 05/25/2016    Lab Results  Component Value Date   CREATININE 0.62 03/23/2017   CREATININE 0.68 09/22/2016   CREATININE 0.71 05/25/2016    Lab Results  Component Value Date   WBC 7.1 03/23/2017   HGB 13.7 03/23/2017   HCT 42.3 03/23/2017   PLT 233.0 03/23/2017   GLUCOSE 123 (H) 03/23/2017   CHOL 173 05/25/2016   TRIG 132.0 05/25/2016   HDL 46.50 05/25/2016   LDLDIRECT 127.0 09/22/2016   LDLCALC 100 (H) 05/25/2016   ALT 42 (H) 03/23/2017   AST 70 (H) 03/23/2017   NA 137 03/23/2017   K 3.6 03/23/2017   CL 106 03/23/2017   CREATININE 0.62 03/23/2017   BUN 8 03/23/2017   CO2 25 03/23/2017   TSH 1.39 09/22/2016   HGBA1C 6.2 03/23/2017   MICROALBUR 1.7 09/22/2016    Mm Screening Breast Tomo Bilateral  Result Date: 09/03/2016 CLINICAL DATA:  Screening. EXAM: 2D DIGITAL SCREENING BILATERAL MAMMOGRAM WITH CAD AND ADJUNCT TOMO COMPARISON:  Previous exam(s). ACR Breast Density Category b: There are scattered areas of fibroglandular density. FINDINGS: There are no findings suspicious for malignancy. Images were processed with CAD. IMPRESSION: No mammographic evidence of malignancy. A result letter of this screening mammogram will be mailed directly to the patient. RECOMMENDATION: Screening mammogram in one year. (Code:SM-B-01Y) BI-RADS CATEGORY  1: Negative. Electronically Signed   By: Pamelia Hoit M.D.   On: 09/03/2016 09:01    Assessment & Plan:   Problem List Items Addressed This Visit    Elevated liver enzymes    She  is asymptomatic.  Serologies for viral hepatitis , iron overload and autoimmune hepatitis are needed.        Essential hypertension    Well controlled on current regimen. Renal function  stable, no changes today.  Lab Results  Component Value Date   CREATININE 0.62 03/23/2017   Lab Results  Component Value Date   NA 137 03/23/2017   K 3.6 03/23/2017   CL 106 03/23/2017   CO2 25 03/23/2017         Obesity, diabetes, and hypertension syndrome ([facility])    Improved control, with frequent lows due to skipping meals.  Dietary modifications discussed..  Will suspend the morning dose.of glipizide .  Lab Results  Component Value Date   HGBA1C 6.2 03/23/2017         Relevant Orders   POCT HgB A1C (Completed)   Comprehensive metabolic panel (Completed)   Polyarthritis    With synovitis on exam, but no signs of arthritis on palin films of most symptomatic hand .  Screening for autoimmune arthritis and iron overload. Lab Results  Component Value Date   ANA NEG 03/23/2017   Lab Results  Component Value Date   ESRSEDRATE 47 (H) 03/23/2017         Other Visit Diagnoses    Pain in joint of right hand    -  Primary   Relevant Orders   DG Hand Complete Right (Completed)   Sedimentation rate (Completed)   C-reactive protein (Completed)   Rheumatoid Factor   HLA-B27 antigen   Iron and TIBC   Uric acid (Completed)   CBC with Differential/Platelet (Completed)   Antinuclear Antib (ANA) (Completed)     A total of 40 minutes was spent with patient more than half of which was spent in counseling patient on the above mentioned issues , reviewing and explaining recent labs and imaging studies done, and coordination of care. I have discontinued Ms. Scholler-Hendrix's estradiol and doxycycline. I am also having her maintain her onetouch ultrasoft, spironolactone, citalopram, glipiZIDE, norethindrone-ethinyl estradiol, and glucose blood.  Meds ordered this encounter  Medications  . glucose blood test strip    Sig: Use as instructed    Dispense:  100 each    Refill:  12    Medications Discontinued During This Encounter  Medication Reason  . doxycycline (VIBRA-TABS)  100 MG tablet Completed Course  . estradiol (ESTRACE) 1 MG tablet Patient has not taken in last 30 days  . glucose blood test strip Reorder    Follow-up: Return in about 3 months (around 06/23/2017) for follow up diabetes.   Crecencio Mc, MD

## 2017-03-23 NOTE — Patient Instructions (Addendum)
Congrats on the weight loss!!     Your A1c is now excellent at 6.2!    can stop the glipizide dose in the morning  Take 2.5 mg with dinner only   DO NOT SKIP MEALS.  EAT A PROTEIN BAR IF YOU ARE RUSHED FOR TIME OR NOT REALLY HUNGRY  Here are several low carb protein bars that make great snacks:   Power Crunch  KIND 5 g sugar  [employer]  Atkins

## 2017-03-24 DIAGNOSIS — M130 Polyarthritis, unspecified: Secondary | ICD-10-CM | POA: Insufficient documentation

## 2017-03-24 LAB — ANA: Anti Nuclear Antibody(ANA): NEGATIVE

## 2017-03-24 LAB — IRON AND TIBC
%SAT: 12 % (ref 11–50)
IRON: 44 ug/dL (ref 40–190)
TIBC: 362 ug/dL (ref 250–450)
UIBC: 318 ug/dL

## 2017-03-24 LAB — RHEUMATOID FACTOR

## 2017-03-24 NOTE — Assessment & Plan Note (Addendum)
With synovitis on exam, but no signs of arthritis on palin films of most symptomatic hand .    Screening for autoimmune arthritis and iron overload. Lab Results  Component Value Date   ANA NEG 03/23/2017   Lab Results  Component Value Date   ESRSEDRATE 64 (H) 03/23/2017

## 2017-03-24 NOTE — Assessment & Plan Note (Addendum)
Improved control, with frequent lows due to skipping meals.  Dietary modifications discussed..  Will suspend the morning dose.of glipizide .  Lab Results  Component Value Date   HGBA1C 6.2 03/23/2017

## 2017-03-25 ENCOUNTER — Encounter: Payer: Self-pay | Admitting: Internal Medicine

## 2017-03-27 ENCOUNTER — Encounter: Payer: Self-pay | Admitting: Internal Medicine

## 2017-03-27 ENCOUNTER — Other Ambulatory Visit: Payer: Self-pay | Admitting: Internal Medicine

## 2017-03-27 DIAGNOSIS — R748 Abnormal levels of other serum enzymes: Secondary | ICD-10-CM

## 2017-03-27 DIAGNOSIS — E7800 Pure hypercholesterolemia, unspecified: Secondary | ICD-10-CM

## 2017-03-27 NOTE — Assessment & Plan Note (Signed)
Well controlled on current regimen. Renal function stable, no changes today.  Lab Results  Component Value Date   CREATININE 0.62 03/23/2017   Lab Results  Component Value Date   NA 137 03/23/2017   K 3.6 03/23/2017   CL 106 03/23/2017   CO2 25 03/23/2017

## 2017-03-27 NOTE — Assessment & Plan Note (Signed)
She  is asymptomatic.  Serologies for viral hepatitis , iron overload and autoimmune hepatitis are needed.

## 2017-03-27 NOTE — Addendum Note (Signed)
Addended by: Crecencio Mc on: 03/27/2017 05:32 PM   Modules accepted: Level of Service

## 2017-03-30 LAB — HLA-B27 ANTIGEN: DNA Result:: NEGATIVE

## 2017-04-05 ENCOUNTER — Other Ambulatory Visit (INDEPENDENT_AMBULATORY_CARE_PROVIDER_SITE_OTHER): Payer: 59

## 2017-04-05 DIAGNOSIS — R748 Abnormal levels of other serum enzymes: Secondary | ICD-10-CM | POA: Diagnosis not present

## 2017-04-05 DIAGNOSIS — E7800 Pure hypercholesterolemia, unspecified: Secondary | ICD-10-CM

## 2017-04-05 NOTE — Addendum Note (Signed)
Addended by: Leeanne Rio on: 04/05/2017 11:00 AM   Modules accepted: Orders

## 2017-04-06 LAB — IRON AND TIBC
%SAT: 25 % (ref 11–50)
IRON: 97 ug/dL (ref 40–190)
TIBC: 395 ug/dL (ref 250–450)
UIBC: 298 ug/dL

## 2017-04-06 LAB — LIPID PANEL
Cholesterol: 208 mg/dL — ABNORMAL HIGH (ref <200)
HDL: 57 mg/dL (ref >50)
LDL CALC: 125 mg/dL — AB (ref <100)
TRIGLYCERIDES: 129 mg/dL (ref <150)
Total CHOL/HDL Ratio: 3.6 Ratio (ref <5.0)
VLDL: 26 mg/dL (ref <30)

## 2017-04-06 LAB — HEPATITIS B CORE ANTIBODY, TOTAL: Hep B Core Total Ab: NONREACTIVE

## 2017-04-06 LAB — HEPATIC FUNCTION PANEL
ALBUMIN: 4.2 g/dL (ref 3.6–5.1)
ALK PHOS: 83 U/L (ref 33–115)
ALT: 28 U/L (ref 6–29)
AST: 36 U/L — ABNORMAL HIGH (ref 10–30)
BILIRUBIN TOTAL: 0.5 mg/dL (ref 0.2–1.2)
Bilirubin, Direct: 0.1 mg/dL (ref <=0.2)
Indirect Bilirubin: 0.4 mg/dL (ref 0.2–1.2)
TOTAL PROTEIN: 7.2 g/dL (ref 6.1–8.1)

## 2017-04-06 LAB — ANTI-SMITH ANTIBODY: ENA SM Ab Ser-aCnc: <1

## 2017-04-06 LAB — HEPATITIS B SURFACE ANTIBODY,QUALITATIVE: HEP B S AB: REACTIVE — AB

## 2017-04-06 LAB — HEPATITIS B SURFACE ANTIGEN: HEP B S AG: NONREACTIVE

## 2017-04-06 LAB — HEPATITIS C ANTIBODY: HCV Ab: NONREACTIVE

## 2017-04-06 LAB — FERRITIN: FERRITIN: 148 ng/mL (ref 10–232)

## 2017-04-07 ENCOUNTER — Encounter: Payer: Self-pay | Admitting: Internal Medicine

## 2017-04-07 LAB — MITOCHONDRIAL ANTIBODIES

## 2017-06-03 ENCOUNTER — Encounter: Payer: 59 | Admitting: Obstetrics and Gynecology

## 2017-06-23 ENCOUNTER — Ambulatory Visit: Payer: Self-pay | Admitting: Internal Medicine

## 2017-06-23 ENCOUNTER — Encounter: Payer: Self-pay | Admitting: Obstetrics and Gynecology

## 2017-06-23 ENCOUNTER — Ambulatory Visit (INDEPENDENT_AMBULATORY_CARE_PROVIDER_SITE_OTHER): Payer: 59 | Admitting: Obstetrics and Gynecology

## 2017-06-23 VITALS — BP 131/86 | HR 84 | Ht 61.0 in | Wt 168.7 lb

## 2017-06-23 DIAGNOSIS — E119 Type 2 diabetes mellitus without complications: Secondary | ICD-10-CM | POA: Diagnosis not present

## 2017-06-23 DIAGNOSIS — I10 Essential (primary) hypertension: Secondary | ICD-10-CM

## 2017-06-23 DIAGNOSIS — E669 Obesity, unspecified: Secondary | ICD-10-CM | POA: Diagnosis not present

## 2017-06-23 DIAGNOSIS — Z01419 Encounter for gynecological examination (general) (routine) without abnormal findings: Secondary | ICD-10-CM | POA: Diagnosis not present

## 2017-06-23 DIAGNOSIS — E66811 Obesity, class 1: Secondary | ICD-10-CM

## 2017-06-23 NOTE — Progress Notes (Signed)
GYNECOLOGY ANNUAL PHYSICAL EXAM PROGRESS NOTE  Subjective:    Mariah Brandt is a 46 y.o. G75P1001  married female who presents for an annual exam. The patient has the following complaints today: none. The patient is sexually active. The patient wears seatbelts: yes. The patient participates in regular exercise: no. Has the patient ever been transfused or tattooed?: no. The patient reports that there is not domestic violence in her life.    Gynecologic History  Menarche age: 67 Patient's last menstrual period was 06/14/2017. Contraception: combined OCPs.  History of STI's: Denies Last Pap: 02/2015. Results were: normal.  Denies h/o abnormal pap smears. Last mammogram: 08/2016. Results were: normal   Obstetric History   G1   P1   T1   P0   A0   L1    SAB0   TAB0   Ectopic0   Multiple0   Live Births1     # Outcome Date GA Lbr Len/2nd Weight Sex Delivery Anes PTL Lv  1 Term 1995 [redacted]w[redacted]d 6 lb 15 oz (3.147 kg) M Vag-Spont   LIV      Past Medical History:  Diagnosis Date  . Anxiety   . Constipation, chronic 1996   after chloecystetomy  . Diabetes mellitus without complication ([place]   . Dysmenorrhea   . Essential hypertension 06/09/2015  . Menorrhagia   . Obesity (BMI 30-39.9)     Past Surgical History:  Procedure Laterality Date  . CHOLECYSTECTOMY  1996    Family History  Problem Relation Age of Onset  . Hyperlipidemia Mother   . Fibroids Mother   . Thyroid disease Mother   . Heart disease Father   . Diabetes Paternal Aunt   . Diabetes Maternal Grandmother     Social History   Socioeconomic History  . Marital status: Married    Spouse name: Not on file  . Number of children: Not on file  . Years of education: Not on file  . Highest education level: Not on file  Social Needs  . Financial resource strain: Not on file  . Food insecurity - worry: Not on file  . Food insecurity - inability: Not on file  . Transportation needs - medical: Not on file    . Transportation needs - non-medical: Not on file  Occupational History  . Not on file  Tobacco Use  . Smoking status: Never Smoker  . Smokeless tobacco: Never Used  Substance and Sexual Activity  . Alcohol use: No    Comment: Pt drinks socially, maybe once a month  . Drug use: No  . Sexual activity: Yes    Birth control/protection: Injection  Other Topics Concern  . Not on file  Social History Narrative  . Not on file    Current Outpatient Medications on File Prior to Visit  Medication Sig Dispense Refill  . citalopram (CELEXA) 10 MG tablet Take 1 tablet (10 mg total) by mouth daily. 90 tablet 1  . glipiZIDE (GLUCOTROL) 5 MG tablet TAKE 0.5 TABLETS BY MOUTH 2 TIMES DAILY BEFORE A MEAL. 30 tablet 3  . glucose blood test strip Use as instructed 100 each 12  . Lancets (ONETOUCH ULTRASOFT) lancets Use as instructed to test blood sugars twice a day 100 each 12  . norethindrone-ethinyl estradiol (JUNEL FE 1/20) 1-20 MG-MCG tablet Take 1 tablet by mouth daily. 2 Package 6  . spironolactone (ALDACTONE) 50 MG tablet Take 1 tablet (50 mg total) by mouth daily. 90 tablet 1  No current facility-administered medications on file prior to visit.     No Known Allergies   Review of Systems Constitutional: negative for chills, fatigue, fevers and sweats Eyes: negative for irritation, redness and visual disturbance Ears, nose, mouth, throat, and face: negative for hearing loss, nasal congestion, snoring and tinnitus Respiratory: negative for asthma, cough, sputum Cardiovascular: negative for chest pain, dyspnea, exertional chest pressure/discomfort, irregular heart beat, palpitations and syncope Gastrointestinal: negative for abdominal pain, change in bowel habits, nausea and vomiting Genitourinary:Negative for abnormal menstrual periods, genital lesions, sexual problems and vaginal discharge, dysuria and urinary incontinence Integument/breast: negative for breast lump, breast tenderness  and nipple discharge Hematologic/lymphatic: negative for bleeding and easy bruising Musculoskeletal:negative for back pain and muscle weakness Neurological: negative for dizziness, headaches, vertigo and weakness Endocrine: negative for diabetic symptoms including polydipsia, polyuria and skin dryness Allergic/Immunologic: negative for hay fever and urticaria      Objective:  Blood pressure 131/86, pulse 84, height 5' 1"  (1.549 m), weight 168 lb 11.2 oz (76.5 kg), last menstrual period 06/14/2017. Body mass index is 31.88 kg/m.  General Appearance:    Alert, cooperative, no distress, appears stated age, mildly obese  Head:    Normocephalic, without obvious abnormality, atraumatic  Eyes:    PERRL, conjunctiva/corneas clear, EOM's intact, both eyes  Ears:    Normal external ear canals, both ears  Nose:   Nares normal, septum midline, mucosa normal, no drainage or sinus tenderness  Throat:   Lips, mucosa, and tongue normal; teeth and gums normal  Neck:   Supple, symmetrical, trachea midline, no adenopathy; thyroid: no enlargement/tenderness/nodules; no carotid bruit or JVD  Back:     Symmetric, no curvature, ROM normal, no CVA tenderness  Lungs:     Clear to auscultation bilaterally, respirations unlabored  Chest Wall:    No tenderness or deformity   Heart:    Regular rate and rhythm, S1 and S2 normal, no murmur, rub or gallop  Breast Exam:    No tenderness, masses, or nipple abnormality  Abdomen:     Soft, non-tender, bowel sounds active all four quadrants, no masses, no organomegaly.    Genitalia:    Pelvic:external genitalia normal, vagina without lesions, discharge, or tenderness. Rectovaginal septum  normal. Cervix normal in appearance, no cervical motion tenderness, no adnexal masses or tenderness.  Uterus normal size, shape, mobile, regular contours, nontender.  Rectal:    Normal external sphincter.  No hemorrhoids appreciated. Internal exam not done.   Extremities:   Extremities  normal, atraumatic, no cyanosis or edema  Pulses:   2+ and symmetric all extremities  Skin:   Skin color, texture, turgor normal, no rashes or lesions  Lymph nodes:   Cervical, supraclavicular, and axillary nodes normal  Neurologic:   CNII-XII intact, normal strength, sensation and reflexes throughout   .  Labs:  Lab Results  Component Value Date   WBC 7.1 03/23/2017   HGB 13.7 03/23/2017   HCT 42.3 03/23/2017   MCV 92.6 03/23/2017   PLT 233.0 03/23/2017    Lab Results  Component Value Date   CREATININE 0.62 03/23/2017   BUN 8 03/23/2017   NA 137 03/23/2017   K 3.6 03/23/2017   CL 106 03/23/2017   CO2 25 03/23/2017    Lab Results  Component Value Date   ALT 28 04/05/2017   AST 36 (H) 04/05/2017   ALKPHOS 83 04/05/2017   BILITOT 0.5 04/05/2017    Lab Results  Component Value Date   TSH 1.39  09/22/2016    Lab Results  Component Value Date   CHOL 208 (H) 04/05/2017   HDL 57 04/05/2017   LDLCALC 125 (H) 04/05/2017   LDLDIRECT 127.0 09/22/2016   TRIG 129 04/05/2017   CHOLHDL 3.6 04/05/2017    Lab Results  Component Value Date   HGBA1C 6.2 03/23/2017     Assessment:    Healthy female exam.  Obesity Class I Diabetes  HTN  Plan:   Labs: up to date.  Breast self exam technique reviewed and patient encouraged to perform self-exam monthly. Contraception: combined OCPs. Doing well, no problems.  Discussed healthy lifestyle modifications. Mammogram up to date. Pap smear up to date.  Next pap smear due in 2019.  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 Chronic hypertension and diabetes managed by primary care provider.  Return to clinic in one year for annual exam. RTC in 1 year for annual exam.    [name], MD [facility]

## 2017-06-23 NOTE — Patient Instructions (Signed)

## 2017-06-24 ENCOUNTER — Encounter: Payer: Self-pay | Admitting: Internal Medicine

## 2017-06-24 ENCOUNTER — Ambulatory Visit (INDEPENDENT_AMBULATORY_CARE_PROVIDER_SITE_OTHER): Payer: 59 | Admitting: Internal Medicine

## 2017-06-24 VITALS — BP 116/74 | HR 88 | Temp 98.5°F | Resp 15 | Ht 61.0 in | Wt 171.4 lb

## 2017-06-24 DIAGNOSIS — M25542 Pain in joints of left hand: Secondary | ICD-10-CM | POA: Diagnosis not present

## 2017-06-24 DIAGNOSIS — E1169 Type 2 diabetes mellitus with other specified complication: Principal | ICD-10-CM

## 2017-06-24 DIAGNOSIS — M130 Polyarthritis, unspecified: Secondary | ICD-10-CM

## 2017-06-24 DIAGNOSIS — I10 Essential (primary) hypertension: Secondary | ICD-10-CM | POA: Diagnosis not present

## 2017-06-24 DIAGNOSIS — M25541 Pain in joints of right hand: Secondary | ICD-10-CM

## 2017-06-24 DIAGNOSIS — J069 Acute upper respiratory infection, unspecified: Secondary | ICD-10-CM

## 2017-06-24 DIAGNOSIS — R748 Abnormal levels of other serum enzymes: Secondary | ICD-10-CM

## 2017-06-24 DIAGNOSIS — E1159 Type 2 diabetes mellitus with other circulatory complications: Secondary | ICD-10-CM

## 2017-06-24 DIAGNOSIS — E669 Obesity, unspecified: Secondary | ICD-10-CM

## 2017-06-24 DIAGNOSIS — B9789 Other viral agents as the cause of diseases classified elsewhere: Secondary | ICD-10-CM

## 2017-06-24 DIAGNOSIS — E119 Type 2 diabetes mellitus without complications: Secondary | ICD-10-CM | POA: Diagnosis not present

## 2017-06-24 LAB — POCT GLYCOSYLATED HEMOGLOBIN (HGB A1C): Hemoglobin A1C: 6.1

## 2017-06-24 MED ORDER — CITALOPRAM HYDROBROMIDE 10 MG PO TABS: 10.0000 mg | ORAL_TABLET | Freq: Every day | ORAL | 1 refills | Status: DC

## 2017-06-24 MED ORDER — PREDNISONE 10 MG PO TABS: ORAL_TABLET | ORAL | 0 refills | Status: DC

## 2017-06-24 MED ORDER — MONTELUKAST SODIUM 10 MG PO TABS: 10.0000 mg | ORAL_TABLET | Freq: Every day | ORAL | 3 refills | Status: DC

## 2017-06-24 MED ORDER — FLUTICASONE PROPIONATE 50 MCG/ACT NA SUSP: 2.0000 | Freq: Every day | NASAL | 6 refills | Status: DC

## 2017-06-24 NOTE — Progress Notes (Signed)
Subjective:  Patient ID: Mariah Brandt, female    DOB: 1971-01-23  Age: 46 y.o. MRN: [id number]  CC: The primary encounter diagnosis was Obesity, diabetes, and hypertension syndrome ([place]). Diagnoses of Arthralgia of both hands, Elevated liver enzymes, Obesity (BMI 30-39.9), Polyarthritis, and Viral URI with cough were also pertinent to this visit.  HPI Mariah Brandt presents for 3 month follow up on diabetes and elevated liver enzymes presumed due to fatty liver ([place] not done yet) .  Patient has no complaints today.  Patient is following a low glycemic index diet and taking all prescribed medications regularly without side effects.  Fasting sugars have been under less than 140 most of the time and post prandials have been under 160 except on rare occasions. Patient is mot exercising regularly and has gained several lbs since her last visit.  .  Patient has had an eye exam in the last 12 months and checks feet regularly for signs of infection.  Patient does not walk barefoot outside,  And denies an numbness tingling or burning in feet. Patient is up to date on all recommended vaccinations  Recent labs reviewed for AST elevation which has improved with weight loss  Hands still feeling stiff In the morning.  There is a FH of RA in mother and uncle    Some viral uri symptoms today,  Mild, without fevers.   Lab Results  Component Value Date   MICROALBUR 1.7 09/22/2016     Lab Results  Component Value Date   ALT 31 06/24/2017   AST 24 06/24/2017   ALKPHOS 101 06/24/2017   BILITOT 0.4 06/24/2017     Lab Results  Component Value Date   HGBA1C 6.1 06/24/2017     Outpatient Medications Prior to Visit  Medication Sig Dispense Refill  . glucose blood test strip Use as instructed 100 each 12  . Lancets (ONETOUCH ULTRASOFT) lancets Use as instructed to test blood sugars twice a day 100 each 12  . norethindrone-ethinyl estradiol (JUNEL FE 1/20) 1-20 MG-MCG tablet Take  1 tablet by mouth daily. 2 Package 6  . spironolactone (ALDACTONE) 50 MG tablet Take 1 tablet (50 mg total) by mouth daily. 90 tablet 1  . citalopram (CELEXA) 10 MG tablet Take 1 tablet (10 mg total) by mouth daily. 90 tablet 1  . glipiZIDE (GLUCOTROL) 5 MG tablet TAKE 0.5 TABLETS BY MOUTH 2 TIMES DAILY BEFORE A MEAL. 30 tablet 3   No facility-administered medications prior to visit.     Review of Systems;  Patient denies headache, fevers, malaise, unintentional weight loss, skin rash, eye pain, sinus congestion and sinus pain, sore throat, dysphagia,  hemoptysis , cough, dyspnea, wheezing, chest pain, palpitations, orthopnea, edema, abdominal pain, nausea, melena, diarrhea, constipation, flank pain, dysuria, hematuria, urinary  Frequency, nocturia, numbness, tingling, seizures,  Focal weakness, Loss of consciousness,  Tremor, insomnia, depression, anxiety, and suicidal ideation.      Objective:  BP 116/74 (BP Location: Left Arm, Patient Position: Sitting, Cuff Size: Large)   Pulse 88   Temp 98.5 F (36.9 C) (Oral)   Resp 15   Ht 5' 1"  (1.549 m)   Wt 171 lb 6.4 oz (77.7 kg)   LMP 06/14/2017   SpO2 98%   BMI 32.39 kg/m   BP Readings from Last 3 Encounters:  06/24/17 116/74  06/23/17 131/86  03/23/17 128/80    Wt Readings from Last 3 Encounters:  06/24/17 171 lb 6.4 oz (77.7 kg)  06/23/17 168 lb  11.2 oz (76.5 kg)  03/23/17 174 lb 9.6 oz (79.2 kg)    General appearance: alert, cooperative and appears stated age Ears: normal TM's and external ear canals both ears Throat: lips, mucosa, and tongue normal; teeth and gums normal Neck: no adenopathy, no carotid bruit, supple, symmetrical, trachea midline and thyroid not enlarged, symmetric, no tenderness/mass/nodules Back: symmetric, no curvature. ROM normal. No CVA tenderness. Lungs: clear to auscultation bilaterally Heart: regular rate and rhythm, S1, S2 normal, no murmur, click, rub or gallop Abdomen: soft, non-tender; bowel  sounds normal; no masses,  no organomegaly Pulses: 2+ and symmetric Skin: Skin color, texture, turgor normal. No rashes or lesions Lymph nodes: Cervical, supraclavicular, and axillary nodes normal.  Lab Results  Component Value Date   HGBA1C 6.1 06/24/2017   HGBA1C 6.2 03/23/2017   HGBA1C 7.0 (H) 09/22/2016    Lab Results  Component Value Date   CREATININE 0.91 06/24/2017   CREATININE 0.62 03/23/2017   CREATININE 0.68 09/22/2016    Lab Results  Component Value Date   WBC 7.1 03/23/2017   HGB 13.7 03/23/2017   HCT 42.3 03/23/2017   PLT 233.0 03/23/2017   GLUCOSE 108 (H) 06/24/2017   CHOL 208 (H) 04/05/2017   TRIG 129 04/05/2017   HDL 57 04/05/2017   LDLDIRECT 127.0 09/22/2016   LDLCALC 125 (H) 04/05/2017   ALT 31 06/24/2017   AST 24 06/24/2017   NA 140 06/24/2017   K 4.1 06/24/2017   CL 104 06/24/2017   CREATININE 0.91 06/24/2017   BUN 10 06/24/2017   CO2 28 06/24/2017   TSH 1.39 09/22/2016   HGBA1C 6.1 06/24/2017   MICROALBUR 1.7 09/22/2016    Mm Screening Breast Tomo Bilateral  Result Date: 09/03/2016 CLINICAL DATA:  Screening. EXAM: 2D DIGITAL SCREENING BILATERAL MAMMOGRAM WITH CAD AND ADJUNCT TOMO COMPARISON:  Previous exam(s). ACR Breast Density Category b: There are scattered areas of fibroglandular density. FINDINGS: There are no findings suspicious for malignancy. Images were processed with CAD. IMPRESSION: No mammographic evidence of malignancy. A result letter of this screening mammogram will be mailed directly to the patient. RECOMMENDATION: Screening mammogram in one year. (Code:SM-B-01Y) BI-RADS CATEGORY  1: Negative. Electronically Signed   By: Pamelia Hoit M.D.   On: 09/03/2016 09:01    Assessment & Plan:   Problem List Items Addressed This Visit    Elevated liver enzymes    improved with weight loss.  Autoimmune serologies were negative,  Suspect fatty liver,  Imaging study has been deferred by patient.  Lab Results  Component Value Date   ALT 31  06/24/2017   AST 24 06/24/2017   ALKPHOS 101 06/24/2017   BILITOT 0.4 06/24/2017         Obesity (BMI 30-39.9)    I have addressed  BMI and recommended a low glycemic index diet utilizing smaller more frequent meals to increase metabolism.  I have also recommended that patient start exercising with a goal of 30 minutes of aerobic exercise a minimum of 5 days per week.       Obesity, diabetes, and hypertension syndrome ([place]) - Primary    She has improved her control with diet and would like to stp the glipzide for a trial.  Agree with suspension of medication   Lab Results  Component Value Date   HGBA1C 6.1 06/24/2017   Lab Results  Component Value Date   MICROALBUR 1.7 09/22/2016         Relevant Orders   Comprehensive metabolic panel (Completed)  POCT HgB A1C (Completed)   Polyarthritis    With synovitis on exam, but no signs of arthritis on palin films of most symptomatic hand .    Screening for autoimmune arthritis and iron overload has been done and the possibility of seronegative RA is raised given her family history of persistently elevated ESR.   Referral to rheumatology advised.  Lab Results  Component Value Date   ANA NEG 03/23/2017   RF <14 03/23/2017   Lab Results  Component Value Date   ESRSEDRATE 29 (H) 06/24/2017        Relevant Orders   Ambulatory referral to Rheumatology   Viral URI with cough    Slow to resolve,  With symptoms present for the past 2-3 weeks.  Prednisone taper prescribed.        Other Visit Diagnoses    Arthralgia of both hands       Relevant Orders   Sedimentation rate (Completed)   Ambulatory referral to Rheumatology      I have discontinued Annissa R. Misener-Hendrix's glipiZIDE. I have also changed her citalopram. Additionally, I am having her start on predniSONE, fluticasone, and montelukast. Lastly, I am having her maintain her onetouch ultrasoft, spironolactone, norethindrone-ethinyl estradiol, and glucose  blood.  Meds ordered this encounter  Medications  . citalopram (CELEXA) 10 MG tablet    Sig: Take 1 tablet (10 mg total) daily by mouth.    Dispense:  90 tablet    Refill:  1    Keep on file for next refill  . predniSONE (DELTASONE) 10 MG tablet    Sig: 6 tablets on Day 1 , then reduce by 1 tablet daily until gone    Dispense:  21 tablet    Refill:  0  . fluticasone (FLONASE) 50 MCG/ACT nasal spray    Sig: Place 2 sprays daily into both nostrils.    Dispense:  16 g    Refill:  6  . montelukast (SINGULAIR) 10 MG tablet    Sig: Take 1 tablet (10 mg total) at bedtime by mouth.    Dispense:  30 tablet    Refill:  3    Medications Discontinued During This Encounter  Medication Reason  . citalopram (CELEXA) 10 MG tablet Reorder  . glipiZIDE (GLUCOTROL) 5 MG tablet     Follow-up: No Follow-up on file.   Crecencio Mc, MD

## 2017-06-24 NOTE — Patient Instructions (Addendum)
I am calling in prednisone (tapering dose over 6 days)  as well  as flonase and singulair  To manage your viral syndrome   You can add sudafed PE 10  To 30 mg every 6 hours if needed for sinus headache .    Shopping and housework unfortunately are not exercise because they don't elevate your heart rate and make you huff and puff liek 30 minutes on a treadmill, elliptiglider  or stationery bicycle do  Walking briskly,  Dancing also are great ways to get a 30 minute workout in   If your ESR is still elevated,  I will make a referral to rheumatology to evaluate your joint pain

## 2017-06-25 LAB — COMPREHENSIVE METABOLIC PANEL
ALK PHOS: 101 U/L (ref 39–117)
ALT: 31 U/L (ref 0–35)
AST: 24 U/L (ref 0–37)
Albumin: 4.2 g/dL (ref 3.5–5.2)
BILIRUBIN TOTAL: 0.4 mg/dL (ref 0.2–1.2)
BUN: 10 mg/dL (ref 6–23)
CO2: 28 mEq/L (ref 19–32)
CREATININE: 0.91 mg/dL (ref 0.40–1.20)
Calcium: 9.7 mg/dL (ref 8.4–10.5)
Chloride: 104 mEq/L (ref 96–112)
GFR: 86.18 mL/min (ref >60.00)
GLUCOSE: 108 mg/dL — AB (ref 70–99)
Potassium: 4.1 mEq/L (ref 3.5–5.1)
SODIUM: 140 meq/L (ref 135–145)
TOTAL PROTEIN: 7.3 g/dL (ref 6.0–8.3)

## 2017-06-25 LAB — SEDIMENTATION RATE: SED RATE: 29 mm/h — AB (ref 0–20)

## 2017-06-26 ENCOUNTER — Encounter: Payer: Self-pay | Admitting: Internal Medicine

## 2017-06-26 DIAGNOSIS — J069 Acute upper respiratory infection, unspecified: Secondary | ICD-10-CM | POA: Insufficient documentation

## 2017-06-26 DIAGNOSIS — B9789 Other viral agents as the cause of diseases classified elsewhere: Secondary | ICD-10-CM

## 2017-06-26 NOTE — Assessment & Plan Note (Signed)
I have addressed  BMI and recommended a low glycemic index diet utilizing smaller more frequent meals to increase metabolism.  I have also recommended that patient start exercising with a goal of 30 minutes of aerobic exercise a minimum of 5 days per week.  

## 2017-06-26 NOTE — Assessment & Plan Note (Signed)
improved with weight loss.  Autoimmune serologies were negative,  Suspect fatty liver,  Imaging study has been deferred by patient.  Lab Results  Component Value Date   ALT 31 06/24/2017   AST 24 06/24/2017   ALKPHOS 101 06/24/2017   BILITOT 0.4 06/24/2017

## 2017-06-26 NOTE — Assessment & Plan Note (Signed)
Slow to resolve,  With symptoms present for the past 2-3 weeks.  Prednisone taper prescribed.

## 2017-06-26 NOTE — Assessment & Plan Note (Signed)
With synovitis on exam, but no signs of arthritis on palin films of most symptomatic hand .    Screening for autoimmune arthritis and iron overload has been done and the possibility of seronegative RA is raised given her family history of persistently elevated ESR.   Referral to rheumatology advised.  Lab Results  Component Value Date   ANA NEG 03/23/2017   RF <14 03/23/2017   Lab Results  Component Value Date   ESRSEDRATE 29 (H) 06/24/2017

## 2017-06-26 NOTE — Assessment & Plan Note (Signed)
She has improved her control with diet and would like to stp the glipzide for a trial.  Agree with suspension of medication   Lab Results  Component Value Date   HGBA1C 6.1 06/24/2017   Lab Results  Component Value Date   MICROALBUR 1.7 09/22/2016

## 2017-07-27 DIAGNOSIS — M7989 Other specified soft tissue disorders: Secondary | ICD-10-CM | POA: Diagnosis not present

## 2017-07-27 DIAGNOSIS — E119 Type 2 diabetes mellitus without complications: Secondary | ICD-10-CM | POA: Diagnosis not present

## 2017-07-27 DIAGNOSIS — M79642 Pain in left hand: Secondary | ICD-10-CM | POA: Diagnosis not present

## 2017-07-27 DIAGNOSIS — M1990 Unspecified osteoarthritis, unspecified site: Secondary | ICD-10-CM | POA: Diagnosis not present

## 2017-07-27 DIAGNOSIS — M79641 Pain in right hand: Secondary | ICD-10-CM | POA: Diagnosis not present

## 2017-08-24 DIAGNOSIS — M79642 Pain in left hand: Secondary | ICD-10-CM | POA: Diagnosis not present

## 2017-08-24 DIAGNOSIS — M79641 Pain in right hand: Secondary | ICD-10-CM | POA: Diagnosis not present

## 2017-08-24 DIAGNOSIS — M1990 Unspecified osteoarthritis, unspecified site: Secondary | ICD-10-CM | POA: Diagnosis not present

## 2017-09-15 IMAGING — MG MM DIGITAL SCREENING BILAT W/ TOMO W/ CAD
8 of 13 series · 8 of 29 positions shown · non-contrast
Comparison: Previous exam(s).

CLINICAL DATA: Screening.

EXAM:
2D DIGITAL SCREENING BILATERAL MAMMOGRAM WITH CAD AND ADJUNCT TOMO

[L MLO]
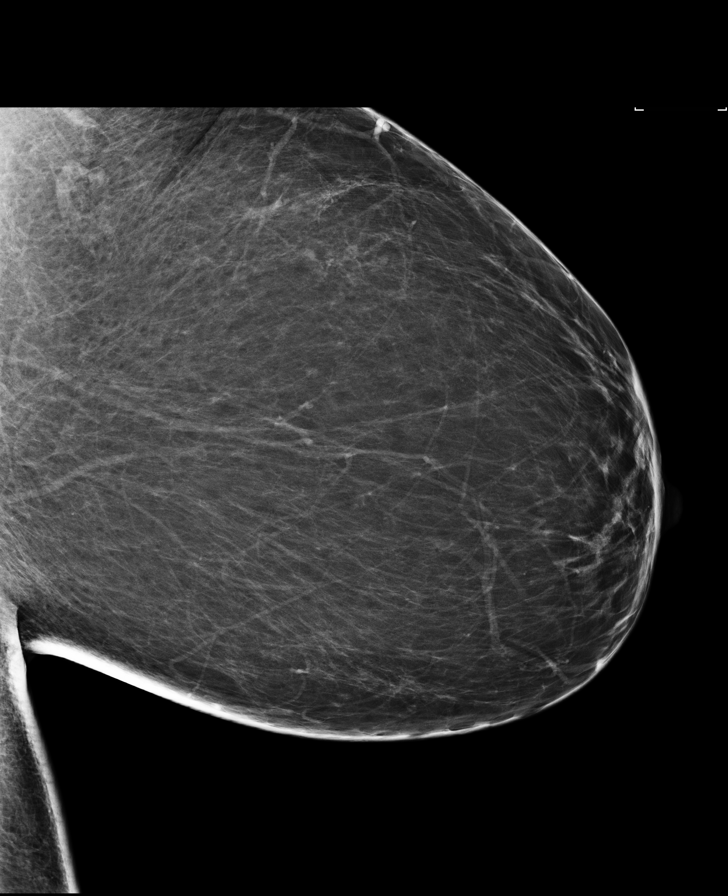

[L CC]
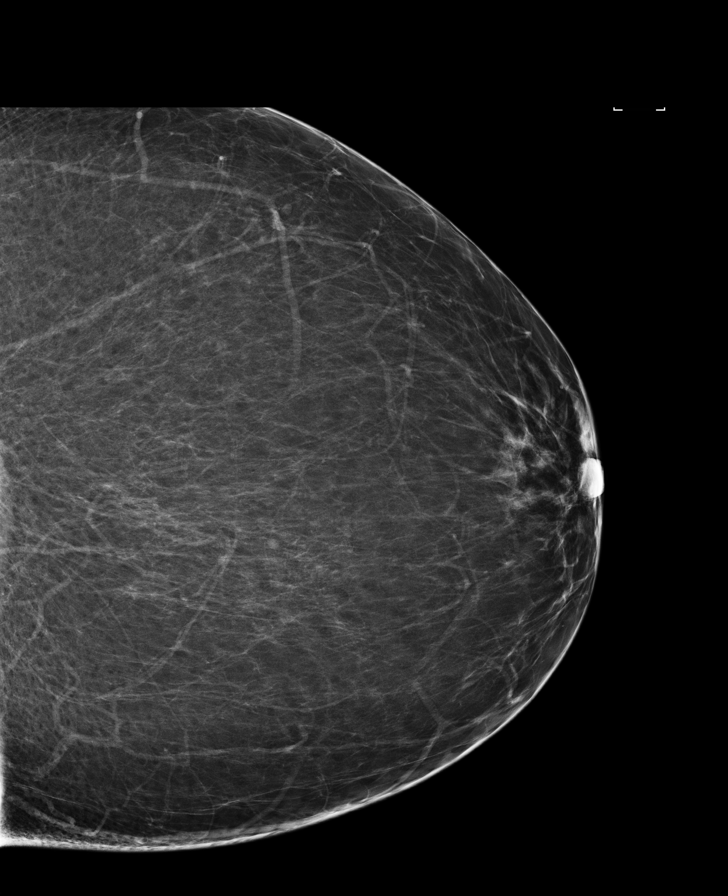

[L CC synth-2D]
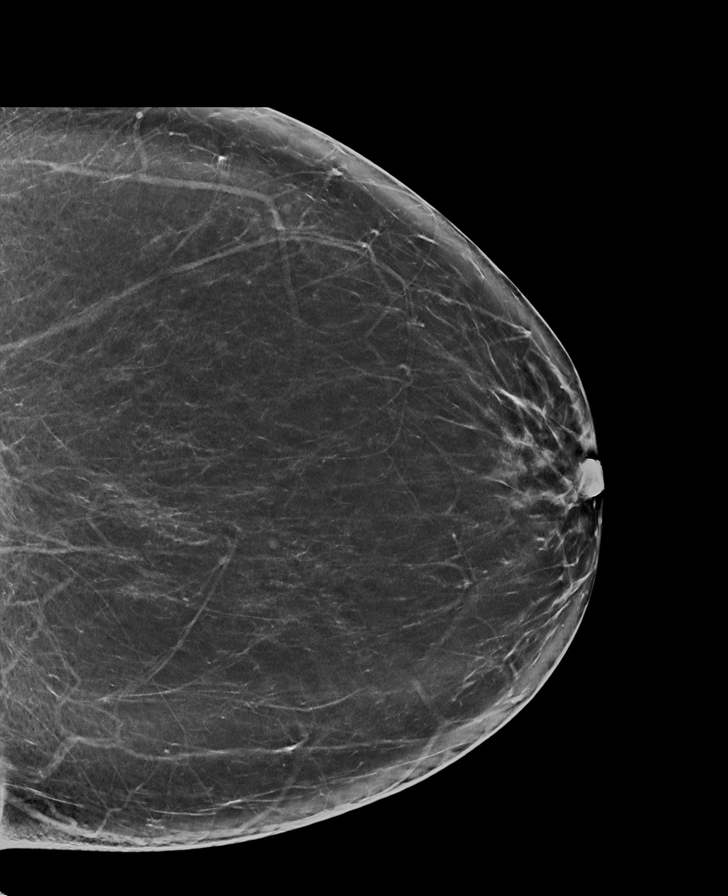

[R MLO]
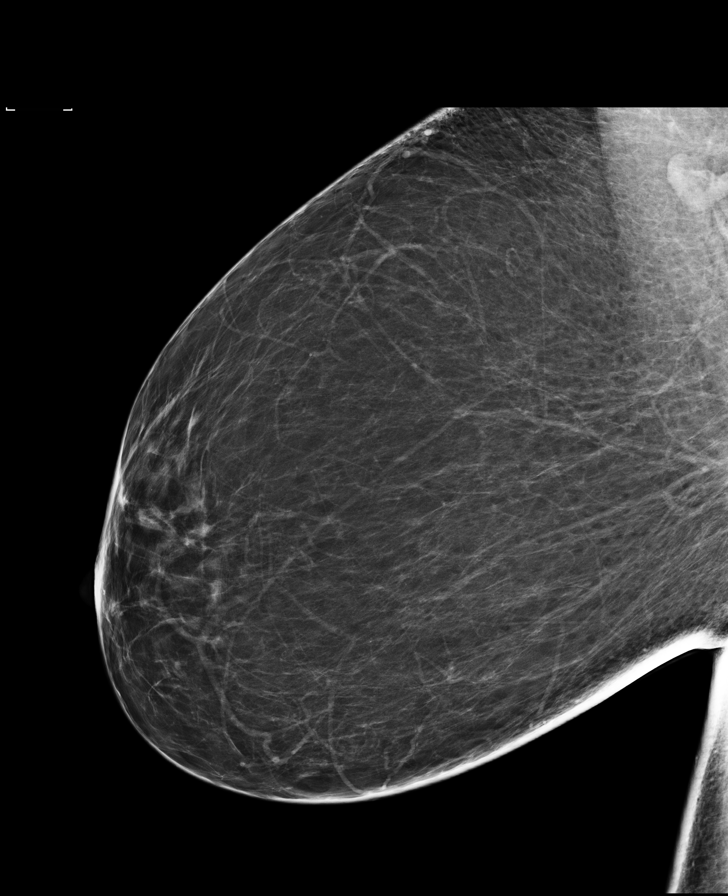

[R MLO synth-2D]
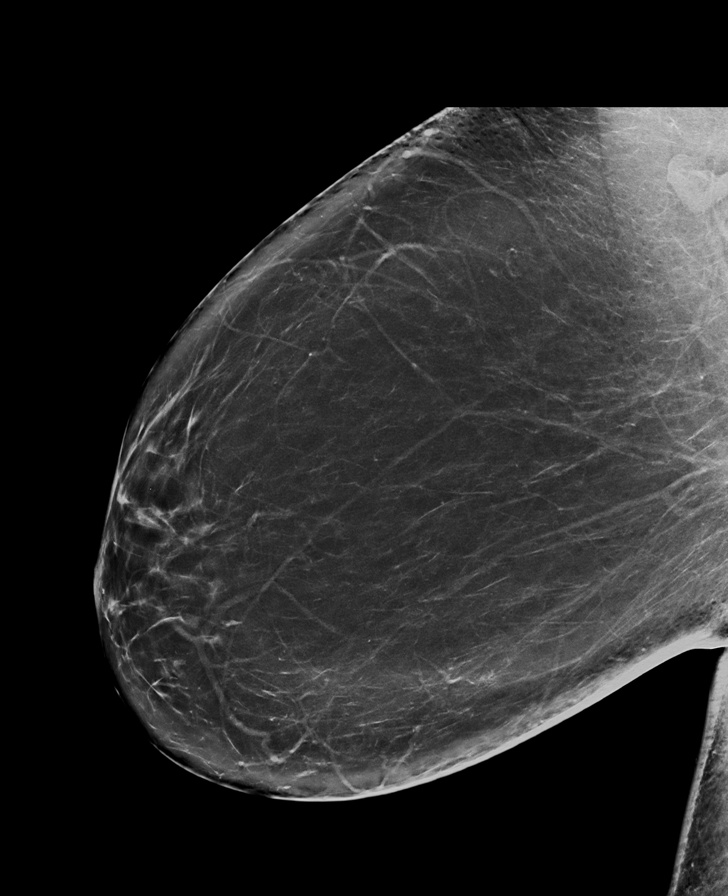

[L MLO synth-2D]
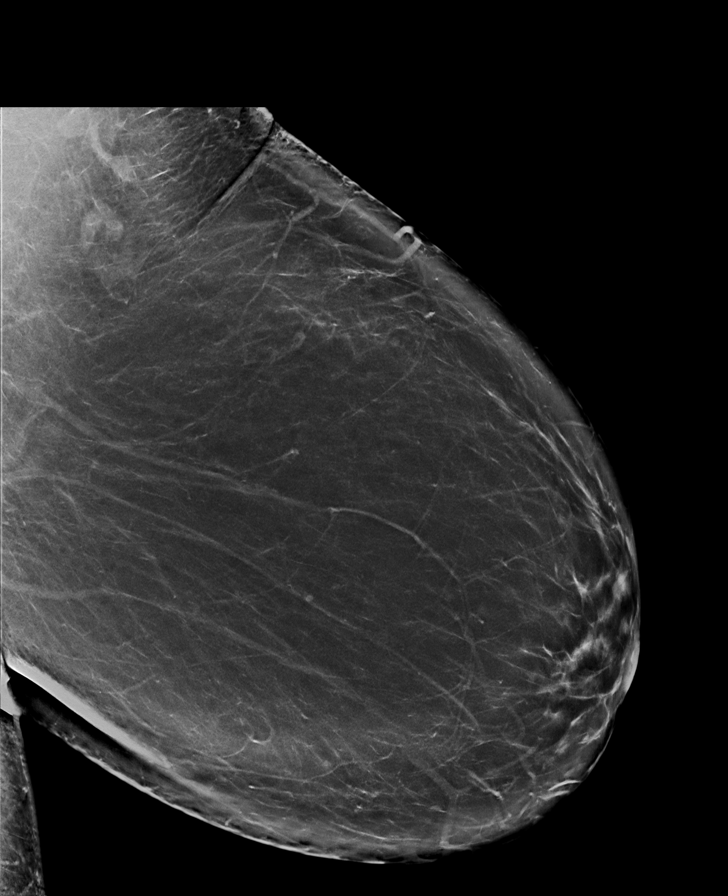

[R CC]
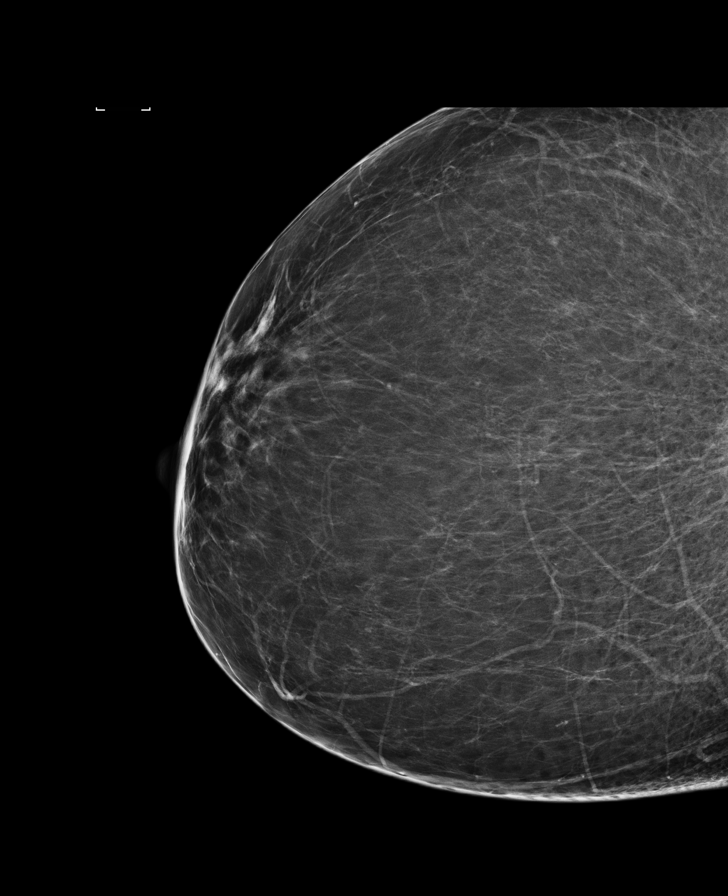

[R CC synth-2D]
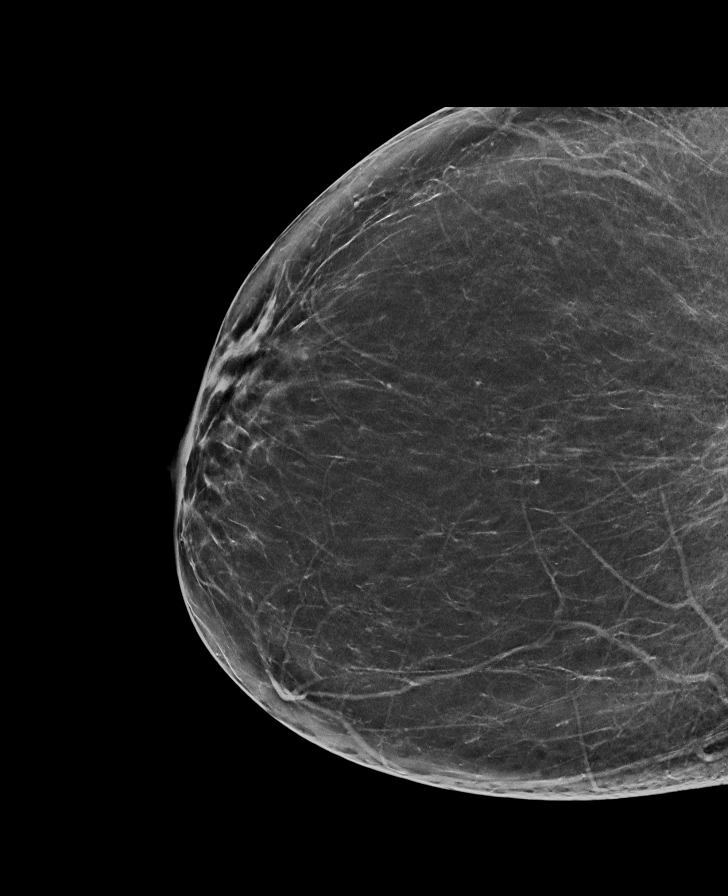

[8 of 29 positions shown; findings below may reference images not displayed]

ACR Breast Density Category b: There are scattered areas of
fibroglandular density.
FINDINGS: There are no findings suspicious for malignancy. Images were
processed with CAD.
IMPRESSION: No mammographic evidence of malignancy. A result letter of this
screening mammogram will be mailed directly to the patient.

RECOMMENDATION:
Screening mammogram in one year. (Code:97-6-RS4)

BI-RADS CATEGORY  1: Negative.

## 2017-09-26 ENCOUNTER — Ambulatory Visit (HOSPITAL_COMMUNITY)
Admission: EM | Admit: 2017-09-26 | Discharge: 2017-09-26 | Disposition: A | Discharge status: Home / Self Care | Payer: 59 | Attending: Internal Medicine | Admitting: Internal Medicine

## 2017-09-26 ENCOUNTER — Encounter (HOSPITAL_COMMUNITY): Payer: Self-pay | Admitting: *Deleted

## 2017-09-26 ENCOUNTER — Other Ambulatory Visit: Payer: Self-pay

## 2017-09-26 DIAGNOSIS — M5412 Radiculopathy, cervical region: Secondary | ICD-10-CM

## 2017-09-26 DIAGNOSIS — M79602 Pain in left arm: Secondary | ICD-10-CM | POA: Diagnosis not present

## 2017-09-26 DIAGNOSIS — M7542 Impingement syndrome of left shoulder: Secondary | ICD-10-CM

## 2017-09-26 HISTORY — DX: Unspecified osteoarthritis, unspecified site: M19.90

## 2017-09-26 NOTE — Discharge Instructions (Addendum)
Pain in the left arm seems most likely to be due to a pinched nerve, or musculoskeletal issue today.  EKG in the urgent care was unremarkable.  Would try Aleve 1-2 tablets 2-3 times daily as needed for pain.  Recheck or follow-up with your primary care provider if not improving as expected in a few days.

## 2017-09-26 NOTE — ED Triage Notes (Addendum)
Reports waking up at 0300 with left arm "throbbing" from left shoulder radiating down into fingers along with numbness in all LUE fingers.  Pain and numbness have been constant since onset; non-reproduceable with palpation.  No also c/o "stiff neck".  LUE fingers swollen.  Pt took 177m ASA.

## 2017-09-26 NOTE — ED Provider Notes (Signed)
[facility]    CSN: [id number] Arrival date & time: 09/26/17  1325     History   Chief Complaint Chief Complaint  Patient presents with  . Arm Pain  . Numbness    HPI Mariah Brandt is a 47 y.o. female.   She presents today with discomfort in the left arm and tingling in the fingertips of the left hand.  She also has a sensation of stiff neck, some discomfort with turning her head with raising her left shoulder overhead and externally rotating.  No weakness or clumsiness in the arm, just discomfort.  Recent diagnosis of seronegative inflammatory arthritis, started on Plaquenil 200 mg daily.  Shortness of breath, no chest discomfort.  She does have a history of hypertension and diet-controlled diabetes.  No family history of premature coronary disease.  Discomfort improved markedly when she took 2 baby aspirin earlier this morning.    HPI  Past Medical History:  Diagnosis Date  . Anxiety   . Arthritis   . Constipation, chronic 1996   after chloecystetomy  . Diabetes mellitus without complication ([place])   . Dysmenorrhea   . Essential hypertension 06/09/2015  . Inflammatory arthritis   . Menorrhagia   . Obesity (BMI 30-39.9)     Patient Active Problem List   Diagnosis Date Noted  . Viral URI with cough 06/26/2017  . Elevated liver enzymes 03/27/2017  . Polyarthritis 03/24/2017  . Furuncle of female breast 10/13/2016  . Fatigue 09/24/2016  . Essential hypertension 06/09/2015  . Menorrhagia with irregular cycle 03/13/2015  . Constipation 06/30/2014  . Encounter for health maintenance examination 06/23/2013  . Adenomyosis 06/21/2013  . GAD (generalized anxiety disorder) 12/07/2011  . Obesity (BMI 30-39.9)   . Obesity, diabetes, and hypertension syndrome ([place])   . Screening for cervical cancer 07/24/2011    Past Surgical History:  Procedure Laterality Date  . CHOLECYSTECTOMY  1996  . DILITATION & CURRETTAGE/HYSTROSCOPY WITH NOVASURE ABLATION  N/A 04/29/2015   Procedure: DILATATION & CURETTAGE/HYSTEROSCOPY WITH NOVASURE ABLATION;  Surgeon: [name], MD;  Location: [facility] ORS;  Service: Gynecology;  Laterality: N/A;    OB History    Gravida Para Term Preterm AB Living   1 1 1     1    SAB TAB Ectopic Multiple Live Births           1       Home Medications    Prior to Admission medications   Medication Sig Start Date End Date Taking? Authorizing Provider  citalopram (CELEXA) 10 MG tablet Take 1 tablet (10 mg total) daily by mouth. 06/24/17  Yes Crecencio Mc, MD  fluticasone (FLONASE) 50 MCG/ACT nasal spray Place 2 sprays daily into both nostrils. 06/24/17  Yes Crecencio Mc, MD  hydroxychloroquine (PLAQUENIL) 200 MG tablet Take by mouth daily.   Yes [provider]  montelukast (SINGULAIR) 10 MG tablet Take 1 tablet (10 mg total) at bedtime by mouth. 06/24/17  Yes Crecencio Mc, MD  norethindrone-ethinyl estradiol (JUNEL FE 1/20) 1-20 MG-MCG tablet Take 1 tablet by mouth daily. 11/25/16  Yes [name], MD  spironolactone (ALDACTONE) 50 MG tablet Take 1 tablet (50 mg total) by mouth daily. 09/22/16  Yes Crecencio Mc, MD  glucose blood test strip Use as instructed 03/23/17   Crecencio Mc, MD  Lancets [facility] ULTRASOFT) lancets Use as instructed to test blood sugars twice a day 10/29/15   Crecencio Mc, MD  predniSONE (DELTASONE) 10 MG tablet 6  tablets on Day 1 , then reduce by 1 tablet daily until gone 06/24/17   Crecencio Mc, MD    Family History Family History  Problem Relation Age of Onset  . Hyperlipidemia Mother   . Fibroids Mother   . Thyroid disease Mother   . Heart disease Father   . Diabetes Paternal Aunt   . Diabetes Maternal Grandmother     Social History Social History   Tobacco Use  . Smoking status: Never Smoker  . Smokeless tobacco: Never Used  Substance Use Topics  . Alcohol use: No  . Drug use: No     Allergies   Patient has no known allergies.   Review of  Systems Review of Systems  All other systems reviewed and are negative.    Physical Exam Triage Vital Signs ED Triage Vitals [09/26/17 1508]  Enc Vitals Group     BP (!) 139/92     Pulse Rate 77     Resp 16     Temp 98.6 F (37 C)     Temp Source Oral     SpO2 100 %     Weight      Height      Pain Score 7     Pain Loc    Updated Vital Signs BP (!) 139/92 (BP Location: Right Arm) Comment: reported BP to nurse Christina Soto  Pulse 77   Temp 98.6 F (37 C) (Oral)   Resp 16   SpO2 100%   Physical Exam  Constitutional: She is oriented to person, place, and time. No distress.  Alert, nicely groomed  HENT:  Head: Atraumatic.  Eyes:  Conjugate gaze, no eye redness/drainage  Neck: Neck supple.  Mild discomfort with neck rotation to the right and left, able to fully extend and fully flex.  Cardiovascular: Normal rate and regular rhythm.  Pulmonary/Chest: No respiratory distress. She has no wheezes. She has no rales.  Lungs clear, symmetric breath sounds  Abdominal: She exhibits no distension.  Musculoskeletal: Normal range of motion.  No leg swelling Able to fully extend B arms overhead at shoulder, int/ext rotate.  Pain with L ext rotation and with full extension overhead on left.  Strength is symmetric, 5/5 prox and distal in B UEs.    Neurological: She is alert and oriented to person, place, and time.  Skin: Skin is warm and dry.  No cyanosis  Nursing note and vitals reviewed.    UC Treatments / Results   EKG NSR, no acute ST or T wave changes.  Procedures Procedures (including critical care time) None today  Final Clinical Impressions(s) / UC Diagnoses   Final diagnoses:  Left arm pain  Left cervical radiculopathy  Impingement syndrome of left shoulder   Pain in the left arm seems most likely to be due to a pinched nerve, or musculoskeletal issue today.  EKG in the urgent care was unremarkable.  Would try Aleve 1-2 tablets 2-3 times daily as needed for  pain.  Recheck or follow-up with your primary care provider if not improving as expected in a few days.     Wynona Luna, MD 09/27/17 2142

## 2017-09-26 NOTE — ED Notes (Signed)
EKG given to Philis Fendt, PA

## 2017-10-04 ENCOUNTER — Other Ambulatory Visit: Payer: Self-pay | Admitting: Internal Medicine

## 2017-10-04 DIAGNOSIS — Z1231 Encounter for screening mammogram for malignant neoplasm of breast: Secondary | ICD-10-CM

## 2017-10-19 ENCOUNTER — Ambulatory Visit
Admission: RE | Admit: 2017-10-19 | Discharge: 2017-10-19 | Disposition: A | Discharge status: Home / Self Care | Payer: 59 | Source: Ambulatory Visit | Attending: Internal Medicine | Admitting: Internal Medicine

## 2017-10-19 DIAGNOSIS — Z1231 Encounter for screening mammogram for malignant neoplasm of breast: Secondary | ICD-10-CM | POA: Diagnosis not present

## 2017-11-23 DIAGNOSIS — Z79899 Other long term (current) drug therapy: Secondary | ICD-10-CM | POA: Diagnosis not present

## 2017-11-23 DIAGNOSIS — M79642 Pain in left hand: Secondary | ICD-10-CM | POA: Diagnosis not present

## 2017-11-23 DIAGNOSIS — M0600 Rheumatoid arthritis without rheumatoid factor, unspecified site: Secondary | ICD-10-CM | POA: Diagnosis not present

## 2017-11-23 DIAGNOSIS — M79641 Pain in right hand: Secondary | ICD-10-CM | POA: Diagnosis not present

## 2017-12-08 ENCOUNTER — Encounter: Payer: Self-pay | Admitting: Obstetrics and Gynecology

## 2017-12-09 ENCOUNTER — Other Ambulatory Visit: Payer: Self-pay

## 2017-12-09 DIAGNOSIS — N938 Other specified abnormal uterine and vaginal bleeding: Secondary | ICD-10-CM

## 2017-12-09 MED ORDER — NORETHIN ACE-ETH ESTRAD-FE 1-20 MG-MCG PO TABS: 1.0000 | ORAL_TABLET | Freq: Every day | ORAL | 2 refills | Status: DC

## 2017-12-10 ENCOUNTER — Ambulatory Visit: Payer: 59 | Attending: Internal Medicine | Admitting: Physical Therapy

## 2017-12-10 DIAGNOSIS — M6281 Muscle weakness (generalized): Secondary | ICD-10-CM | POA: Insufficient documentation

## 2017-12-10 DIAGNOSIS — M79641 Pain in right hand: Secondary | ICD-10-CM | POA: Insufficient documentation

## 2017-12-10 DIAGNOSIS — M25641 Stiffness of right hand, not elsewhere classified: Secondary | ICD-10-CM | POA: Insufficient documentation

## 2017-12-10 DIAGNOSIS — M25642 Stiffness of left hand, not elsewhere classified: Secondary | ICD-10-CM | POA: Insufficient documentation

## 2017-12-10 DIAGNOSIS — M79642 Pain in left hand: Secondary | ICD-10-CM | POA: Insufficient documentation

## 2017-12-18 NOTE — Therapy (Addendum)
[facility] [address] [place], [place], [address] Phone: [phone]   Fax:  [phone]  Physical Therapy Evaluation  Patient Details  Name: Mariah Brandt MRN: [id number] Date of Birth: 1970/08/31 Referring Provider: Dr. Annalee Genta   Encounter Date: 12/10/2017    PT End of Session - 12/17/17 1220    PT Start Time  1410    PT Stop Time  1546    PT Time Calculation (min)  96 min    Activity Tolerance  Patient limited by pain         Past Medical History:  Diagnosis Date  . Anxiety   . Arthritis   . Constipation, chronic 1996   after chloecystetomy  . Diabetes mellitus without complication ([place])   . Dysmenorrhea   . Essential hypertension 06/09/2015  . Inflammatory arthritis   . Menorrhagia   . Obesity (BMI 30-39.9)     Past Surgical History:  Procedure Laterality Date  . CHOLECYSTECTOMY  1996  . DILITATION & CURRETTAGE/HYSTROSCOPY WITH NOVASURE ABLATION N/A 04/29/2015   Procedure: DILATATION & CURETTAGE/HYSTEROSCOPY WITH NOVASURE ABLATION;  Surgeon: [name], MD;  Location: [facility] ORS;  Service: Gynecology;  Laterality: N/A;    There were no vitals filed for this visit.            Plan - 12/27/17 1736    Clinical Impression Statement  See FCE report.  Pt. falls within the Light range.  Based on the individual task scores in Dynamic Strength, Position Tolerance and Mobility, the client is able to tolerate the Light level of work for the 8-hour day/40-hour week.      Clinical Presentation  Evolving    Clinical Decision Making  Moderate    Rehab Potential  Fair    PT Frequency  1x / week    PT Treatment/Interventions  ADLs/Self Care Home Management;Functional mobility training;Therapeutic exercise;Therapeutic activities;Patient/family education;Manual techniques    PT Next Visit Plan  See FCE report       Patient will benefit from skilled therapeutic intervention in order to improve the  following deficits and impairments:  Pain, Decreased mobility, Decreased range of motion, Decreased strength  Visit Diagnosis: Bilateral hand pain  Muscle weakness (generalized)  Stiffness of right hand joint  Stiffness of left hand joint     Problem List Patient Active Problem List   Diagnosis Date Noted  . Viral URI with cough 06/26/2017  . Elevated liver enzymes 03/27/2017  . Polyarthritis 03/24/2017  . Furuncle of female breast 10/13/2016  . Fatigue 09/24/2016  . Essential hypertension 06/09/2015  . Menorrhagia with irregular cycle 03/13/2015  . Constipation 06/30/2014  . Encounter for health maintenance examination 06/23/2013  . Adenomyosis 06/21/2013  . GAD (generalized anxiety disorder) 12/07/2011  . Obesity (BMI 30-39.9)   . Obesity, diabetes, and hypertension syndrome ([place])   . Screening for cervical cancer 07/24/2011   Pura Spice, PT, DPT # [phone] 12/11/2017, 5:41 PM  [place] [facility] [address] [place], [place], [address] Phone: [phone]   Fax:  [phone]  Name: Mariah Brandt MRN: [id number] Date of Birth: July 25, 1971

## 2017-12-27 NOTE — Addendum Note (Signed)
Addended by: Pura Spice on: 12/27/2017 05:55 PM   Modules accepted: Orders

## 2018-01-04 DIAGNOSIS — M0600 Rheumatoid arthritis without rheumatoid factor, unspecified site: Secondary | ICD-10-CM | POA: Diagnosis not present

## 2018-01-04 DIAGNOSIS — Z79899 Other long term (current) drug therapy: Secondary | ICD-10-CM | POA: Diagnosis not present

## 2018-01-24 DIAGNOSIS — Z79899 Other long term (current) drug therapy: Secondary | ICD-10-CM | POA: Diagnosis not present

## 2018-01-24 DIAGNOSIS — M0600 Rheumatoid arthritis without rheumatoid factor, unspecified site: Secondary | ICD-10-CM | POA: Diagnosis not present

## 2018-01-25 ENCOUNTER — Encounter: Payer: Self-pay | Admitting: Internal Medicine

## 2018-01-25 DIAGNOSIS — H25013 Cortical age-related cataract, bilateral: Secondary | ICD-10-CM | POA: Diagnosis not present

## 2018-01-25 DIAGNOSIS — Z7984 Long term (current) use of oral hypoglycemic drugs: Secondary | ICD-10-CM | POA: Diagnosis not present

## 2018-01-25 DIAGNOSIS — H2513 Age-related nuclear cataract, bilateral: Secondary | ICD-10-CM | POA: Diagnosis not present

## 2018-01-25 DIAGNOSIS — H0100A Unspecified blepharitis right eye, upper and lower eyelids: Secondary | ICD-10-CM | POA: Diagnosis not present

## 2018-01-25 DIAGNOSIS — H0100B Unspecified blepharitis left eye, upper and lower eyelids: Secondary | ICD-10-CM | POA: Diagnosis not present

## 2018-01-25 DIAGNOSIS — Z79899 Other long term (current) drug therapy: Secondary | ICD-10-CM | POA: Diagnosis not present

## 2018-01-25 DIAGNOSIS — M069 Rheumatoid arthritis, unspecified: Secondary | ICD-10-CM | POA: Diagnosis not present

## 2018-01-25 DIAGNOSIS — H18413 Arcus senilis, bilateral: Secondary | ICD-10-CM | POA: Diagnosis not present

## 2018-01-25 DIAGNOSIS — E119 Type 2 diabetes mellitus without complications: Secondary | ICD-10-CM | POA: Diagnosis not present

## 2018-01-25 LAB — HM DIABETES EYE EXAM

## 2018-02-16 ENCOUNTER — Encounter: Payer: Self-pay | Admitting: Internal Medicine

## 2018-02-23 MED ORDER — CITALOPRAM HYDROBROMIDE 10 MG PO TABS: 10.0000 mg | ORAL_TABLET | Freq: Every day | ORAL | 0 refills | Status: DC

## 2018-02-23 NOTE — Addendum Note (Signed)
Addended by: Adair Laundry on: 02/23/2018 09:43 AM   Modules accepted: Orders

## 2018-03-28 DIAGNOSIS — M0600 Rheumatoid arthritis without rheumatoid factor, unspecified site: Secondary | ICD-10-CM | POA: Diagnosis not present

## 2018-03-28 DIAGNOSIS — Z79899 Other long term (current) drug therapy: Secondary | ICD-10-CM | POA: Diagnosis not present

## 2018-05-03 DIAGNOSIS — M0600 Rheumatoid arthritis without rheumatoid factor, unspecified site: Secondary | ICD-10-CM | POA: Diagnosis not present

## 2018-05-03 DIAGNOSIS — Z79899 Other long term (current) drug therapy: Secondary | ICD-10-CM | POA: Diagnosis not present

## 2018-05-03 DIAGNOSIS — M1990 Unspecified osteoarthritis, unspecified site: Secondary | ICD-10-CM | POA: Diagnosis not present

## 2018-06-07 DIAGNOSIS — M0600 Rheumatoid arthritis without rheumatoid factor, unspecified site: Secondary | ICD-10-CM | POA: Diagnosis not present

## 2018-06-07 DIAGNOSIS — Z79899 Other long term (current) drug therapy: Secondary | ICD-10-CM | POA: Diagnosis not present

## 2018-06-30 ENCOUNTER — Encounter: Payer: Self-pay | Admitting: Obstetrics and Gynecology

## 2018-06-30 ENCOUNTER — Encounter: Payer: 59 | Admitting: Obstetrics and Gynecology

## 2018-06-30 ENCOUNTER — Ambulatory Visit (INDEPENDENT_AMBULATORY_CARE_PROVIDER_SITE_OTHER): Payer: 59 | Admitting: Obstetrics and Gynecology

## 2018-06-30 ENCOUNTER — Other Ambulatory Visit (HOSPITAL_COMMUNITY)
Admission: RE | Admit: 2018-06-30 | Discharge: 2018-06-30 | Disposition: A | Discharge status: Home / Self Care | Payer: 59 | Source: Ambulatory Visit | Attending: Obstetrics and Gynecology | Admitting: Obstetrics and Gynecology

## 2018-06-30 VITALS — BP 148/91 | HR 81 | Ht 61.0 in | Wt 171.1 lb

## 2018-06-30 DIAGNOSIS — I10 Essential (primary) hypertension: Secondary | ICD-10-CM

## 2018-06-30 DIAGNOSIS — Z1239 Encounter for other screening for malignant neoplasm of breast: Secondary | ICD-10-CM | POA: Diagnosis not present

## 2018-06-30 DIAGNOSIS — Z01419 Encounter for gynecological examination (general) (routine) without abnormal findings: Secondary | ICD-10-CM

## 2018-06-30 DIAGNOSIS — Z3041 Encounter for surveillance of contraceptive pills: Secondary | ICD-10-CM | POA: Diagnosis not present

## 2018-06-30 DIAGNOSIS — Z124 Encounter for screening for malignant neoplasm of cervix: Secondary | ICD-10-CM | POA: Insufficient documentation

## 2018-06-30 DIAGNOSIS — E119 Type 2 diabetes mellitus without complications: Secondary | ICD-10-CM | POA: Diagnosis not present

## 2018-06-30 DIAGNOSIS — M069 Rheumatoid arthritis, unspecified: Secondary | ICD-10-CM

## 2018-06-30 DIAGNOSIS — E669 Obesity, unspecified: Secondary | ICD-10-CM | POA: Diagnosis not present

## 2018-06-30 MED ORDER — NORETHINDRONE ACET-ETHINYL EST 1.5-30 MG-MCG PO TABS: 1.0000 | ORAL_TABLET | Freq: Every day | ORAL | 4 refills | Status: DC

## 2018-06-30 MED ORDER — NORETHINDRONE ACET-ETHINYL EST 1.5-30 MG-MCG PO TABS: 1.0000 | ORAL_TABLET | Freq: Every day | ORAL | 11 refills | Status: DC

## 2018-06-30 NOTE — Patient Instructions (Signed)

## 2018-06-30 NOTE — Progress Notes (Signed)
GYNECOLOGY ANNUAL PHYSICAL EXAM PROGRESS NOTE  Subjective:    Mariah Brandt is a 47 y.o. G90P1001  married female who presents for an annual exam. The patient has the following complaints today: none. The patient is sexually active. The patient wears seatbelts: yes. The patient participates in regular exercise: no. Has the patient ever been transfused or tattooed?: no. The patient reports that there is not domestic violence in her life.    1. Notes that she was diagnosed with RA several months ago, and was started on Methotrexate.  Notes that after increasing her dose of Methotrexate from 6 pills to 8 pills 2 months ago, she has begun having occasional light vaginal bleeding. Notes that she did read that side effects could include vaginal bleeding.  2. Also notes that she had episodes of heavier bleeding when her pharmacy tried to switch her pills to a different generic, but this has since been corrected with pharmacy.   Gynecologic History  Menarche age: 41 No LMP recorded. Patient has had an ablation.  Contraception: combined OCPs.  History of STI's: Denies Last Pap: 02/2015. Results were: normal.  Denies h/o abnormal pap smears. Last mammogram: 10/2017. Results were: normal   OB History  Gravida Para Term Preterm AB Living  1 1 1  0 0 1  SAB TAB Ectopic Multiple Live Births  0 0 0 0 1    # Outcome Date GA Lbr Len/2nd Weight Sex Delivery Anes PTL Lv  1 Term 1995 [redacted]w[redacted]d 6 lb 15 oz (3.147 kg) M Vag-Spont   LIV    Past Medical History:  Diagnosis Date  . Anxiety   . Arthritis   . Arthritis, rheumatoid ([place]   . Constipation, chronic 1996   after chloecystetomy  . Diabetes mellitus without complication ([place]   . Dysmenorrhea   . Essential hypertension 06/09/2015  . Inflammatory arthritis   . Menorrhagia   . Obesity (BMI 30-39.9)     Past Surgical History:  Procedure Laterality Date  . CHOLECYSTECTOMY  1996  . DILITATION & CURRETTAGE/HYSTROSCOPY WITH NOVASURE  ABLATION N/A 04/29/2015   Procedure: DILATATION & CURETTAGE/HYSTEROSCOPY WITH NOVASURE ABLATION;  Surgeon: ARubie Maid MD;  Location: [facility] ORS;  Service: Gynecology;  Laterality: N/A;    Family History  Problem Relation Age of Onset  . Hyperlipidemia Mother   . Fibroids Mother   . Thyroid disease Mother   . Heart disease Father   . Diabetes Paternal Aunt   . Diabetes Maternal Grandmother     Social History   Socioeconomic History  . Marital status: Married    Spouse name: Not on file  . Number of children: Not on file  . Years of education: Not on file  . Highest education level: Not on file  Occupational History  . Not on file  Social Needs  . Financial resource strain: Not on file  . Food insecurity:    Worry: Not on file    Inability: Not on file  . Transportation needs:    Medical: Not on file    Non-medical: Not on file  Tobacco Use  . Smoking status: Never Smoker  . Smokeless tobacco: Never Used  Substance and Sexual Activity  . Alcohol use: No  . Drug use: No  . Sexual activity: Yes    Birth control/protection: Pill  Lifestyle  . Physical activity:    Days per week: Not on file    Minutes per session: Not on file  . Stress: Not on  file  Relationships  . Social connections:    Talks on phone: Not on file    Gets together: Not on file    Attends religious service: Not on file    Active member of club or organization: Not on file    Attends meetings of clubs or organizations: Not on file    Relationship status: Not on file  . Intimate partner violence:    Fear of current or ex partner: Not on file    Emotionally abused: Not on file    Physically abused: Not on file    Forced sexual activity: Not on file  Other Topics Concern  . Not on file  Social History Narrative  . Not on file    Current Outpatient Medications on File Prior to Visit  Medication Sig Dispense Refill  . citalopram (CELEXA) 10 MG tablet Take 1 tablet (10 mg total) by mouth daily.  90 tablet 0  . fluticasone (FLONASE) 50 MCG/ACT nasal spray Place 2 sprays daily into both nostrils. 16 g 6  . glucose blood test strip Use as instructed 100 each 12  . hydroxychloroquine (PLAQUENIL) 200 MG tablet Take by mouth daily.    . Lancets (ONETOUCH ULTRASOFT) lancets Use as instructed to test blood sugars twice a day 100 each 12  . methotrexate (RHEUMATREX) 7.5 MG tablet Take 7.5 mg by mouth once a week. Caution" Chemotherapy. Protect from light.    . montelukast (SINGULAIR) 10 MG tablet Take 1 tablet (10 mg total) at bedtime by mouth. 30 tablet 3  . norethindrone-ethinyl estradiol (JUNEL FE 1/20) 1-20 MG-MCG tablet Take 1 tablet by mouth daily. 3 Package 2   No current facility-administered medications on file prior to visit.     No Known Allergies   Review of Systems Constitutional: negative for chills, fatigue, fevers and sweats. Positive for weight loss (intentional, 9 lbs).  Eyes: negative for irritation, redness and visual disturbance Ears, nose, mouth, throat, and face: negative for hearing loss, nasal congestion, snoring and tinnitus Respiratory: negative for asthma, cough, sputum Cardiovascular: negative for chest pain, dyspnea, exertional chest pressure/discomfort, irregular heart beat, palpitations and syncope Gastrointestinal: negative for abdominal pain, change in bowel habits, nausea and vomiting Genitourinary:Negative for abnormal menstrual periods, genital lesions, sexual problems and vaginal discharge, dysuria and urinary incontinence Integument/breast: negative for breast lump, breast tenderness and nipple discharge Hematologic/lymphatic: negative for bleeding and easy bruising Musculoskeletal:negative for back pain and muscle weakness Neurological: negative for dizziness, headaches, vertigo and weakness Endocrine: negative for diabetic symptoms including polydipsia, polyuria and skin dryness Allergic/Immunologic: negative for hay fever and urticaria        Objective:  Blood pressure (!) 148/91, pulse 81, height 5' 1"  (1.549 m), weight 171 lb 1.6 oz (77.6 kg). Body mass index is 32.33 kg/m.  General Appearance:    Alert, cooperative, no distress, appears stated age, mildly obese  Head:    Normocephalic, without obvious abnormality, atraumatic  Eyes:    PERRL, conjunctiva/corneas clear, EOM's intact, both eyes  Ears:    Normal external ear canals, both ears  Nose:   Nares normal, septum midline, mucosa normal, no drainage or sinus tenderness  Throat:   Lips, mucosa, and tongue normal; teeth and gums normal  Neck:   Supple, symmetrical, trachea midline, no adenopathy; thyroid: no enlargement/tenderness/nodules; no carotid bruit or JVD  Back:     Symmetric, no curvature, ROM normal, no CVA tenderness  Lungs:     Clear to auscultation bilaterally, respirations unlabored  Chest  Wall:    No tenderness or deformity   Heart:    Regular rate and rhythm, S1 and S2 normal, no murmur, rub or gallop  Breast Exam:    No tenderness, masses, or nipple abnormality  Abdomen:     Soft, non-tender, bowel sounds active all four quadrants, no masses, no organomegaly.    Genitalia:    Pelvic:external genitalia normal, vagina without lesions, discharge, or tenderness. Rectovaginal septum  normal. Cervix normal in appearance, no cervical motion tenderness, no adnexal masses or tenderness.  Uterus normal size, shape, mobile, regular contours, nontender.  Rectal:    Normal external sphincter.  No hemorrhoids appreciated. Internal exam not done.   Extremities:   Extremities normal, atraumatic, no cyanosis or edema  Pulses:   2+ and symmetric all extremities  Skin:   Skin color, texture, turgor normal, no rashes or lesions  Lymph nodes:   Cervical, supraclavicular, and axillary nodes normal  Neurologic:   CNII-XII intact, normal strength, sensation and reflexes throughout   .  Labs:  Lab Results  Component Value Date   TSH 1.39 09/22/2016    Lab Results   Component Value Date   CHOL 208 (H) 04/05/2017   HDL 57 04/05/2017   LDLCALC 125 (H) 04/05/2017   LDLDIRECT 127.0 09/22/2016   TRIG 129 04/05/2017   CHOLHDL 3.6 04/05/2017    Lab Results  Component Value Date   HGBA1C 6.1 06/24/2017    CBC and CMP performed 06/07/2018 in [place] (by Rheumatologist), reviewed.   Assessment:   Healthy female exam.  Obesity Class I Diabetes  HTN Rheumatoid Arthritis Contraception surveillance  Plan:   Labs: HgbA1c ordered today. Patient notes she has not yet seen her PCP this year.   Breast self exam technique reviewed and patient encouraged to perform self-exam monthly. Contraception: combined OCPs. Discussed option of changing contraceptive method vs increasing dosing of current pill due to increased bleeding with use of methotrexate for her RA. Patient notes she would prefer to increase dose. Will change from 20 mcg tab to 30 mcg tablet.   Discussed healthy lifestyle modifications. Mammogram up to date. Pap smear performed today.   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 Chronic hypertension and diabetes managed by primary care provider. Patient notes that she has been able to come off of her medications by monitoring her diet. Blood pressure mildly elevated today. Advised to f/u with PCP.  Return to clinic in one year for annual exam.   [name], MD [facility]

## 2018-06-30 NOTE — Progress Notes (Signed)
PT is present today for her annual exam. Pt stated that she has been doing self-breast exams monthly. Pt stated that she is doing well and denies any issues. No problems or concerns.

## 2018-07-01 LAB — TSH: TSH: 2.17 u[IU]/mL (ref 0.450–4.500)

## 2018-07-01 LAB — HEMOGLOBIN A1C
ESTIMATED AVERAGE GLUCOSE: 123 mg/dL
Hgb A1c MFr Bld: 5.9 % — ABNORMAL HIGH (ref 4.8–5.6)

## 2018-07-05 LAB — CYTOLOGY - PAP
Diagnosis: NEGATIVE
HPV (WINDOPATH): NOT DETECTED

## 2018-07-15 ENCOUNTER — Ambulatory Visit (INDEPENDENT_AMBULATORY_CARE_PROVIDER_SITE_OTHER): Payer: Self-pay | Admitting: Obstetrics and Gynecology

## 2018-07-15 ENCOUNTER — Encounter: Payer: Self-pay | Admitting: Obstetrics and Gynecology

## 2018-07-15 VITALS — BP 130/90 | HR 88 | Temp 99.7°F | Wt 172.2 lb

## 2018-07-15 DIAGNOSIS — B029 Zoster without complications: Secondary | ICD-10-CM | POA: Insufficient documentation

## 2018-07-15 MED ORDER — VALACYCLOVIR HCL 1 G PO TABS: 1000.0000 mg | ORAL_TABLET | Freq: Three times a day (TID) | ORAL | 1 refills | Status: DC

## 2018-07-15 NOTE — Progress Notes (Signed)
  Subjective:     Patient ID: Mariah Brandt, female   DOB: [birth year], 47 y.o.   MRN: [id number]  HPI  Mariah Brandt is a 47 y.o. female here with an area on her buttocks where she has sores. She first noticed it last Saturday. She has had no pain. She has been putting peroxide and triple antibiotic ointment on it. No history of HSV; married with one partner x 13 years. She has noticed that the area is draining and then another sore popped up next to it. She never had chicken pox as a child however does think she got the vaccine. She has never had this before.   Review of Systems  Genitourinary: Negative for difficulty urinating and vaginal discharge.  Skin: Positive for wound.   Objective:   Physical Exam  Constitutional: She is oriented to person, place, and time. She appears well-developed and well-nourished. No distress.  HENT:  Head: Normocephalic.  Eyes: Pupils are equal, round, and reactive to light.  Genitourinary:  Genitourinary Comments: On the right side of the intergluteal cleft there are several herpetic lesions. Some are open and others are pustule. Non tender to touch.  Musculoskeletal: Normal range of motion.  Neurological: She is alert and oriented to person, place, and time.  Skin: Skin is warm and dry. She is not diaphoretic.   Assessment:   1. Herpes zoster without complication    Plan:   47 year old female here with lesions on gluteal cleft X 6 days. No other complications. Physical exam suggests herpes zoster; likely shingles, unlikely genital herpes.  -Unable to swab here or draw HSV labs - Recommend close  f/u with PCP; Monday or Tuesday  - Rx Valtrex TID x7 days - Pelvic rest   Danton Palmateer, Artist Pais, NP 07/15/2018 5:43 PM

## 2018-08-31 ENCOUNTER — Ambulatory Visit: Payer: 59 | Admitting: Internal Medicine

## 2018-08-31 ENCOUNTER — Encounter: Payer: Self-pay | Admitting: Internal Medicine

## 2018-08-31 VITALS — BP 149/96 | HR 86 | Temp 98.4°F | Ht 61.0 in | Wt 170.4 lb

## 2018-08-31 DIAGNOSIS — B009 Herpesviral infection, unspecified: Secondary | ICD-10-CM

## 2018-08-31 DIAGNOSIS — B029 Zoster without complications: Secondary | ICD-10-CM

## 2018-08-31 NOTE — Progress Notes (Signed)
Subjective:  Patient ID: Mariah Brandt, female    DOB: Dec 05, 1970  Age: 48 y.o. MRN: [id number]  CC: The primary encounter diagnosis was Herpes zoster without complication. A diagnosis of HSV-2 (herpes simplex virus 2) infection was also pertinent to this visit.  HPI Mariah Brandt presents for evaluation of a recurrent herpetic rash that has occurred  perimenstrually for the last 3 months,  near her natal cleft.  First episode occurred after shaving the area and  Was treated as a herpes zoster outbreak with acyclovir which resolved. Recurred a month later,  prior to her period.  Self treated with leftover acyclovir and resolved in 2 days.  This is her 3rd epsiode . The lesions are not tender,  No prodromal symptoms .Marland Kitchen  Not sexually active.  Has been married 13 years.  No prior history of herpetic lesions on any mucosa   Outpatient Medications Prior to Visit  Medication Sig Dispense Refill  . citalopram (CELEXA) 10 MG tablet Take 1 tablet (10 mg total) by mouth daily. 90 tablet 0  . fluticasone (FLONASE) 50 MCG/ACT nasal spray Place 2 sprays daily into both nostrils. 16 g 6  . glucose blood test strip Use as instructed 100 each 12  . hydroxychloroquine (PLAQUENIL) 200 MG tablet Take by mouth daily.    . Lancets (ONETOUCH ULTRASOFT) lancets Use as instructed to test blood sugars twice a day 100 each 12  . methotrexate (RHEUMATREX) 7.5 MG tablet Take 7.5 mg by mouth once a week. Caution" Chemotherapy. Protect from light.    . montelukast (SINGULAIR) 10 MG tablet Take 1 tablet (10 mg total) at bedtime by mouth. 30 tablet 3  . Norethindrone Acetate-Ethinyl Estradiol (JUNEL 1.5/30) 1.5-30 MG-MCG tablet Take 1 tablet by mouth daily. 3 Package 4  . valACYclovir (VALTREX) 1000 MG tablet Take 1 tablet (1,000 mg total) by mouth 3 (three) times daily. 21 tablet 1   No facility-administered medications prior to visit.     Review of Systems;  Patient denies headache, fevers,  malaise, unintentional weight loss, skin rash, eye pain, sinus congestion and sinus pain, sore throat, dysphagia,  hemoptysis , cough, dyspnea, wheezing, chest pain, palpitations, orthopnea, edema, abdominal pain, nausea, melena, diarrhea, constipation, flank pain, dysuria, hematuria, urinary  Frequency, nocturia, numbness, tingling, seizures,  Focal weakness, Loss of consciousness,  Tremor, insomnia, depression, anxiety, and suicidal ideation.      Objective:  BP (!) 149/96   Pulse 86   Temp 98.4 F (36.9 C) (Oral)   Ht 5' 1"  (1.549 m)   Wt 170 lb 6.4 oz (77.3 kg)   SpO2 98%   BMI 32.20 kg/m   BP Readings from Last 3 Encounters:  08/31/18 (!) 149/96  07/15/18 130/90  06/30/18 (!) 148/91    Wt Readings from Last 3 Encounters:  08/31/18 170 lb 6.4 oz (77.3 kg)  07/15/18 172 lb 3.2 oz (78.1 kg)  06/30/18 171 lb 1.6 oz (77.6 kg)    General appearance: alert, cooperative and appears stated age Neck: no adenopathy, no carotid bruit, supple, symmetrical, trachea midline and thyroid not enlarged, symmetric, no tenderness/mass/nodules Skin: grouping of several small 1 mm sized pustules on right buttock near the natal cleft.  Lymph nodes: Cervical, supraclavicular, and axillary nodes normal.  Lab Results  Component Value Date   HGBA1C 5.9 (H) 06/30/2018   HGBA1C 6.1 06/24/2017   HGBA1C 6.2 03/23/2017    Lab Results  Component Value Date   CREATININE 0.91 06/24/2017   CREATININE 0.62  03/23/2017   CREATININE 0.68 09/22/2016    Lab Results  Component Value Date   WBC 7.1 03/23/2017   HGB 13.7 03/23/2017   HCT 42.3 03/23/2017   PLT 233.0 03/23/2017   GLUCOSE 108 (H) 06/24/2017   CHOL 208 (H) 04/05/2017   TRIG 129 04/05/2017   HDL 57 04/05/2017   LDLDIRECT 127.0 09/22/2016   LDLCALC 125 (H) 04/05/2017   ALT 31 06/24/2017   AST 24 06/24/2017   NA 140 06/24/2017   K 4.1 06/24/2017   CL 104 06/24/2017   CREATININE 0.91 06/24/2017   BUN 10 06/24/2017   CO2 28 06/24/2017    TSH 2.170 06/30/2018   HGBA1C 5.9 (H) 06/30/2018   MICROALBUR 1.7 09/22/2016    No results found.  Assessment & Plan:   Problem List Items Addressed This Visit    Herpes zoster without complication - Primary   Relevant Medications   valACYclovir (VALTREX) 1000 MG tablet   Other Relevant Orders   HSV(herpes simplex vrs) 1+2 ab-IgG (Completed)   Herpes simplex virus culture (Completed)   HSV-2 (herpes simplex virus 2) infection    Confirmed with viral culture and serologies for antibodies. She has had 2-3 occurrences in the last 3 months.  Recommend treatment of current episode followed by daily suppression .  Return for other STD testing advised.       Relevant Medications   valACYclovir (VALTREX) 1000 MG tablet    A total of 25 minutes of face to face time was spent with patient more than half of which was spent in counselling about the above mentioned conditions  and coordination of care    I am having Mariah Brandt start on valACYclovir. I am also having her maintain her onetouch ultrasoft, glucose blood, fluticasone, montelukast, hydroxychloroquine, citalopram, methotrexate, and Norethindrone Acetate-Ethinyl Estradiol.  Meds ordered this encounter  Medications  . valACYclovir (VALTREX) 1000 MG tablet    Sig: Take 1 tablet (1,000 mg total) by mouth daily. For prevention of outbreaks    Dispense:  90 tablet    Refill:  1    There are no discontinued medications.  Follow-up: No follow-ups on file.   Crecencio Mc, MD

## 2018-09-01 LAB — HSV(HERPES SIMPLEX VRS) I + II AB-IGG

## 2018-09-02 ENCOUNTER — Other Ambulatory Visit: Payer: Self-pay | Admitting: Internal Medicine

## 2018-09-02 LAB — HERPES SIMPLEX VIRUS CULTURE
MICRO NUMBER:: 89249
SPECIMEN QUALITY:: ADEQUATE

## 2018-09-02 MED ORDER — VALACYCLOVIR HCL 1 G PO TABS: 1000.0000 mg | ORAL_TABLET | Freq: Three times a day (TID) | ORAL | 1 refills | Status: DC

## 2018-09-03 DIAGNOSIS — B009 Herpesviral infection, unspecified: Secondary | ICD-10-CM | POA: Insufficient documentation

## 2018-09-03 MED ORDER — VALACYCLOVIR HCL 1 G PO TABS: 1000.0000 mg | ORAL_TABLET | Freq: Every day | ORAL | 1 refills | Status: DC

## 2018-09-03 NOTE — Assessment & Plan Note (Signed)
Confirmed with viral culture and serologies for antibodies. She has had 2-3 occurrences in the last 3 months.  Recommend treatment of current episode followed by daily suppression .  Return for other STD testing advised.

## 2018-09-20 DIAGNOSIS — M0600 Rheumatoid arthritis without rheumatoid factor, unspecified site: Secondary | ICD-10-CM | POA: Diagnosis not present

## 2018-09-20 DIAGNOSIS — Z79899 Other long term (current) drug therapy: Secondary | ICD-10-CM | POA: Diagnosis not present

## 2018-09-20 DIAGNOSIS — M1990 Unspecified osteoarthritis, unspecified site: Secondary | ICD-10-CM | POA: Diagnosis not present

## 2019-03-20 DIAGNOSIS — M0600 Rheumatoid arthritis without rheumatoid factor, unspecified site: Secondary | ICD-10-CM | POA: Diagnosis not present

## 2019-03-20 DIAGNOSIS — Z79899 Other long term (current) drug therapy: Secondary | ICD-10-CM | POA: Diagnosis not present

## 2019-06-20 DIAGNOSIS — Z79899 Other long term (current) drug therapy: Secondary | ICD-10-CM | POA: Diagnosis not present

## 2019-06-20 DIAGNOSIS — M0600 Rheumatoid arthritis without rheumatoid factor, unspecified site: Secondary | ICD-10-CM | POA: Diagnosis not present

## 2019-07-05 ENCOUNTER — Encounter: Payer: 59 | Admitting: Obstetrics and Gynecology

## 2019-07-13 ENCOUNTER — Other Ambulatory Visit: Payer: Self-pay

## 2019-07-13 ENCOUNTER — Ambulatory Visit (INDEPENDENT_AMBULATORY_CARE_PROVIDER_SITE_OTHER): Payer: 59 | Admitting: Obstetrics and Gynecology

## 2019-07-13 ENCOUNTER — Encounter: Payer: Self-pay | Admitting: Obstetrics and Gynecology

## 2019-07-13 VITALS — BP 144/96 | HR 80 | Ht 61.0 in | Wt 168.9 lb

## 2019-07-13 DIAGNOSIS — Z01419 Encounter for gynecological examination (general) (routine) without abnormal findings: Secondary | ICD-10-CM

## 2019-07-13 DIAGNOSIS — Z1231 Encounter for screening mammogram for malignant neoplasm of breast: Secondary | ICD-10-CM | POA: Diagnosis not present

## 2019-07-13 DIAGNOSIS — B009 Herpesviral infection, unspecified: Secondary | ICD-10-CM

## 2019-07-13 DIAGNOSIS — N951 Menopausal and female climacteric states: Secondary | ICD-10-CM | POA: Diagnosis not present

## 2019-07-13 DIAGNOSIS — E119 Type 2 diabetes mellitus without complications: Secondary | ICD-10-CM

## 2019-07-13 DIAGNOSIS — Z13220 Encounter for screening for lipoid disorders: Secondary | ICD-10-CM | POA: Diagnosis not present

## 2019-07-13 DIAGNOSIS — I10 Essential (primary) hypertension: Secondary | ICD-10-CM

## 2019-07-13 DIAGNOSIS — E669 Obesity, unspecified: Secondary | ICD-10-CM | POA: Diagnosis not present

## 2019-07-13 MED ORDER — PREMPRO 0.3-1.5 MG PO TABS: 1.0000 | ORAL_TABLET | Freq: Every day | ORAL | 11 refills | Status: DC

## 2019-07-13 NOTE — Progress Notes (Signed)
Patient comes in today for annual exam. She has no concerns today. She would like to have labs done today. She is due for mammogram.

## 2019-07-13 NOTE — Patient Instructions (Addendum)
Health Maintenance, Female Adopting a healthy lifestyle and getting preventive care are important in promoting health and wellness. Ask your health care provider about:  The right schedule for you to have regular tests and exams.  Things you can do on your own to prevent diseases and keep yourself healthy. What should I know about diet, weight, and exercise? Eat a healthy diet   Eat a diet that includes plenty of vegetables, fruits, low-fat dairy products, and lean protein.  Do not eat a lot of foods that are high in solid fats, added sugars, or sodium. Maintain a healthy weight Body mass index (BMI) is used to identify weight problems. It estimates body fat based on height and weight. Your health care provider can help determine your BMI and help you achieve or maintain a healthy weight. Get regular exercise Get regular exercise. This is one of the most important things you can do for your health. Most adults should:  Exercise for at least 150 minutes each week. The exercise should increase your heart rate and make you sweat (moderate-intensity exercise).  Do strengthening exercises at least twice a week. This is in addition to the moderate-intensity exercise.  Spend less time sitting. Even light physical activity can be beneficial. Watch cholesterol and blood lipids Have your blood tested for lipids and cholesterol at 46 years of age, then have this test every 5 years. Have your cholesterol levels checked more often if:  Your lipid or cholesterol levels are high.  You are older than 46 years of age.  You are at high risk for heart disease. What should I know about cancer screening? Depending on your health history and family history, you may need to have cancer screening at various ages. This may include screening for:  Breast cancer.  Cervical cancer.  Colorectal cancer.  Skin cancer.  Lung cancer. What should I know about heart disease, diabetes, and high blood  pressure? Blood pressure and heart disease  High blood pressure causes heart disease and increases the risk of stroke. This is more likely to develop in people who have high blood pressure readings, are of African descent, or are overweight.  Have your blood pressure checked: ? Every 3-5 years if you are 18-39 years of age. ? Every year if you are 40 years old or older. Diabetes Have regular diabetes screenings. This checks your fasting blood sugar level. Have the screening done:  Once every three years after age 40 if you are at a normal weight and have a low risk for diabetes.  More often and at a younger age if you are overweight or have a high risk for diabetes. What should I know about preventing infection? Hepatitis B If you have a higher risk for hepatitis B, you should be screened for this virus. Talk with your health care provider to find out if you are at risk for hepatitis B infection. Hepatitis C Testing is recommended for:  Everyone born from 1945 through 1965.  Anyone with known risk factors for hepatitis C. Sexually transmitted infections (STIs)  Get screened for STIs, including gonorrhea and chlamydia, if: ? You are sexually active and are younger than 46 years of age. ? You are older than 46 years of age and your health care provider tells you that you are at risk for this type of infection. ? Your sexual activity has changed since you were last screened, and you are at increased risk for chlamydia or gonorrhea. Ask your health care provider if   you are at risk.  Ask your health care provider about whether you are at high risk for HIV. Your health care provider may recommend a prescription medicine to help prevent HIV infection. If you choose to take medicine to prevent HIV, you should first get tested for HIV. You should then be tested every 3 months for as long as you are taking the medicine. Pregnancy  If you are about to stop having your period (premenopausal) and  you may become pregnant, seek counseling before you get pregnant.  Take 400 to 800 micrograms (mcg) of folic acid every day if you become pregnant.  Ask for birth control (contraception) if you want to prevent pregnancy. Osteoporosis and menopause Osteoporosis is a disease in which the bones lose minerals and strength with aging. This can result in bone fractures. If you are 70 years old or older, or if you are at risk for osteoporosis and fractures, ask your health care provider if you should:  Be screened for bone loss.  Take a calcium or vitamin D supplement to lower your risk of fractures.  Be given hormone replacement therapy (HRT) to treat symptoms of menopause. Follow these instructions at home: Lifestyle  Do not use any products that contain nicotine or tobacco, such as cigarettes, e-cigarettes, and chewing tobacco. If you need help quitting, ask your health care provider.  Do not use street drugs.  Do not share needles.  Ask your health care provider for help if you need support or information about quitting drugs. Alcohol use  Do not drink alcohol if: ? Your health care provider tells you not to drink. ? You are pregnant, may be pregnant, or are planning to become pregnant.  If you drink alcohol: ? Limit how much you use to 0-1 drink a day. ? Limit intake if you are breastfeeding.  Be aware of how much alcohol is in your drink. In the [place] one drink equals one 12 oz bottle of beer (355 mL), one 5 oz glass of wine (148 mL), or one 1 oz glass of hard liquor (44 mL). General instructions  Schedule regular health, dental, and eye exams.  Stay current with your vaccines.  Tell your health care provider if: ? You often feel depressed. ? You have ever been abused or do not feel safe at home. Summary  Adopting a healthy lifestyle and getting preventive care are important in promoting health and wellness.  Follow your health care provider's instructions about healthy  diet, exercising, and getting tested or screened for diseases.  Follow your health care provider's instructions on monitoring your cholesterol and blood pressure. This information is not intended to replace advice given to you by your health care provider. Make sure you discuss any questions you have with your health care provider. Document Released: 02/09/2011 Document Revised: 07/20/2018 Document Reviewed: 07/20/2018 Elsevier Patient Education  2020 [place] is the normal time of life before and after menstrual periods stop completely (menopause). Perimenopause can begin 2-8 years before menopause, and it usually lasts for 1 year after menopause. During perimenopause, the ovaries may or may not produce an egg. What are the causes? This condition is caused by a natural change in hormone levels that happens as you get older. What increases the risk? This condition is more likely to start at an earlier age if you have certain medical conditions or treatments, including:  A tumor of the pituitary gland in the brain.  A disease that  affects the ovaries and hormone production.  Radiation treatment for cancer.  Certain cancer treatments, such as chemotherapy or hormone (anti-estrogen) therapy.  Heavy smoking and excessive alcohol use.  Family history of early menopause. What are the signs or symptoms? Perimenopausal changes affect each woman differently. Symptoms of this condition may include:  Hot flashes.  Night sweats.  Irregular menstrual periods.  Decreased sex drive.  Vaginal dryness.  Headaches.  Mood swings.  Depression.  Memory problems or trouble concentrating.  Irritability.  Tiredness.  Weight gain.  Anxiety.  Trouble getting pregnant. How is this diagnosed? This condition is diagnosed based on your medical history, a physical exam, your age, your menstrual history, and your symptoms. Hormone tests may also be  done. How is this treated? In some cases, no treatment is needed. You and your health care provider should make a decision together about whether treatment is necessary. Treatment will be based on your individual condition and preferences. Various treatments are available, such as:  Menopausal hormone therapy (MHT).  Medicines to treat specific symptoms.  Acupuncture.  Vitamin or herbal supplements. Before starting treatment, make sure to let your health care provider know if you have a personal or family history of:  Heart disease.  Breast cancer.  Blood clots.  Diabetes.  Osteoporosis. Follow these instructions at home: Lifestyle  Do not use any products that contain nicotine or tobacco, such as cigarettes and e-cigarettes. If you need help quitting, ask your health care provider.  Eat a balanced diet that includes fresh fruits and vegetables, whole grains, soybeans, eggs, lean meat, and low-fat dairy.  Get at least 30 minutes of physical activity on 5 or more days each week.  Avoid alcoholic and caffeinated beverages, as well as spicy foods. This may help prevent hot flashes.  Get 7-8 hours of sleep each night.  Dress in layers that can be removed to help you manage hot flashes.  Find ways to manage stress, such as deep breathing, meditation, or journaling. General instructions  Keep track of your menstrual periods, including: ? When they occur. ? How heavy they are and how long they last. ? How much time passes between periods.  Keep track of your symptoms, noting when they start, how often you have them, and how long they last.  Take over-the-counter and prescription medicines only as told by your health care provider.  Take vitamin supplements only as told by your health care provider. These may include calcium, vitamin E, and vitamin D.  Use vaginal lubricants or moisturizers to help with vaginal dryness and improve comfort during sex.  Talk with your  health care provider before starting any herbal supplements.  Keep all follow-up visits as told by your health care provider. This is important. This includes any group therapy or counseling. Contact a health care provider if:  You have heavy vaginal bleeding or pass blood clots.  Your period lasts more than 2 days longer than normal.  Your periods are recurring sooner than 21 days.  You bleed after having sex. Get help right away if:  You have chest pain, trouble breathing, or trouble talking.  You have severe depression.  You have pain when you urinate.  You have severe headaches.  You have vision problems. Summary  Perimenopause is the time when a woman's body begins to move into menopause. This may happen naturally or as a result of other health problems or medical treatments.  Perimenopause can begin 2-8 years before menopause, and it usually lasts  for 1 year after menopause.  Perimenopausal symptoms can be managed through medicines, lifestyle changes, and complementary therapies such as acupuncture. This information is not intended to replace advice given to you by your health care provider. Make sure you discuss any questions you have with your health care provider. Document Released: 09/03/2004 Document Revised: 07/09/2017 Document Reviewed: 09/01/2016 Elsevier Patient Education  2020 [employer].

## 2019-07-13 NOTE — Progress Notes (Signed)
GYNECOLOGY ANNUAL PHYSICAL EXAM PROGRESS NOTE  Subjective:    Mariah Brandt is a 48 y.o. G14P1001 married female who presents for an annual exam. The patient has the following complaints today: none. The patient is sexually active. The patient wears seatbelts: yes. The patient participates in regular exercise: no. Has the patient ever been transfused or tattooed?: no. The patient reports that there is not domestic violence in her life.   Patient inquires as to whether or not she should continue taking her birth control. States that she discontinued several months ago. She and her husband are not sexually active often (due to his medical issues), and her being diagnosed with HSV last year.  When they are active, they use condoms.   Gynecologic History  Menarche age: 87 No LMP recorded. Patient has had an ablation.  Contraception: combined OCPs (recdently discontinued) and condoms.  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  Last Pap: 06/2018. Results were: normal.  Denies h/o abnormal pap smears. Last mammogram: 10/2017. Results were: normal   OB History  Gravida Para Term Preterm AB Living  1 1 1  0 0 1  SAB TAB Ectopic Multiple Live Births  0 0 0 0 1    # Outcome Date GA Lbr Len/2nd Weight Sex Delivery Anes PTL Lv  1 Term 1995 [redacted]w[redacted]d 6 lb 15 oz (3.147 kg) M Vag-Spont   LIV    Past Medical History:  Diagnosis Date  . Anxiety   . Arthritis   . Arthritis, rheumatoid ([facility]   . Constipation, chronic 1996   after chloecystetomy  . Diabetes mellitus without complication ([facility]   . Dysmenorrhea   . Essential hypertension 06/09/2015  . Inflammatory arthritis   . Menorrhagia   . Obesity (BMI 30-39.9)     Past Surgical History:  Procedure Laterality Date  . CHOLECYSTECTOMY  1996  . DILITATION & CURRETTAGE/HYSTROSCOPY WITH NOVASURE ABLATION N/A 04/29/2015   Procedure: DILATATION & CURETTAGE/HYSTEROSCOPY WITH NOVASURE ABLATION;  Surgeon: ARubie Maid MD;   Location: [facility] ORS;  Service: Gynecology;  Laterality: N/A;    Family History  Problem Relation Age of Onset  . Hyperlipidemia Mother   . Fibroids Mother   . Thyroid disease Mother   . Heart disease Father   . Diabetes Paternal Aunt   . Diabetes Maternal Grandmother     Social History   Socioeconomic History  . Marital status: Married    Spouse name: Not on file  . Number of children: Not on file  . Years of education: Not on file  . Highest education level: Not on file  Occupational History  . Not on file  Social Needs  . Financial resource strain: Not on file  . Food insecurity    Worry: Not on file    Inability: Not on file  . Transportation needs    Medical: Not on file    Non-medical: Not on file  Tobacco Use  . Smoking status: Never Smoker  . Smokeless tobacco: Never Used  Substance and Sexual Activity  . Alcohol use: No  . Drug use: No  . Sexual activity: Yes    Birth control/protection: Pill  Lifestyle  . Physical activity    Days per week: Not on file    Minutes per session: Not on file  . Stress: Not on file  Relationships  . Social [place] phone: Not on file    Gets together: Not on file  Attends religious service: Not on file    Active member of club or organization: Not on file    Attends meetings of clubs or organizations: Not on file    Relationship status: Not on file  . Intimate partner violence    Fear of current or ex partner: Not on file    Emotionally abused: Not on file    Physically abused: Not on file    Forced sexual activity: Not on file  Other Topics Concern  . Not on file  Social History Narrative  . Not on file    Current Outpatient Medications on File Prior to Visit  Medication Sig Dispense Refill  . citalopram (CELEXA) 10 MG tablet Take 1 tablet (10 mg total) by mouth daily. 90 tablet 0  . fluticasone (FLONASE) 50 MCG/ACT nasal spray Place 2 sprays daily into both nostrils. 16 g 6  . methotrexate  (RHEUMATREX) 7.5 MG tablet Take 7.5 mg by mouth once a week. Caution" Chemotherapy. Protect from light.    . montelukast (SINGULAIR) 10 MG tablet Take 1 tablet (10 mg total) at bedtime by mouth. 30 tablet 3  . valACYclovir (VALTREX) 1000 MG tablet Take 1 tablet (1,000 mg total) by mouth 3 (three) times daily. 21 tablet 1  . glucose blood test strip Use as instructed (Patient not taking: Reported on 07/13/2019) 100 each 12  . hydroxychloroquine (PLAQUENIL) 200 MG tablet Take by mouth daily.    . Lancets (ONETOUCH ULTRASOFT) lancets Use as instructed to test blood sugars twice a day (Patient not taking: Reported on 07/13/2019) 100 each 12  . Norethindrone Acetate-Ethinyl Estradiol (JUNEL 1.5/30) 1.5-30 MG-MCG tablet Take 1 tablet by mouth daily. (Patient not taking: Reported on 07/13/2019) 3 Package 4   No current facility-administered medications on file prior to visit.     No Known Allergies   Review of Systems Constitutional: negative for chills, fatigue, fevers and sweats, unintententional weight loss or gain. Positive for low energy levels Eyes: negative for irritation, redness and visual disturbance Ears, nose, mouth, throat, and face: negative for hearing loss, nasal congestion, snoring and tinnitus Respiratory: negative for asthma, cough, sputum Cardiovascular: negative for chest pain, dyspnea, exertional chest pressure/discomfort, irregular heart beat, palpitations and syncope Gastrointestinal: negative for abdominal pain, change in bowel habits, nausea and vomiting Genitourinary:Negative for abnormal menstrual periods, genital lesions, sexual problems and vaginal discharge, dysuria and urinary incontinence. Positive for hot flushes Integument/breast: negative for breast lump, breast tenderness and nipple discharge Hematologic/lymphatic: negative for bleeding and easy bruising Musculoskeletal:negative for back pain and muscle weakness Neurological: negative for dizziness, headaches,  vertigo and weakness Endocrine: negative for diabetic symptoms including polydipsia, polyuria and skin dryness Allergic/Immunologic: negative for hay fever and urticaria      Objective:  Blood pressure (!) 144/96, pulse 80, height 5' 1"  (1.549 m), weight 168 lb 14.4 oz (76.6 kg). Body mass index is 31.91 kg/m.  General Appearance:    Alert, cooperative, no distress, appears stated age, mildly obese  Head:    Normocephalic, without obvious abnormality, atraumatic  Eyes:    PERRL, conjunctiva/corneas clear, EOM's intact, both eyes  Ears:    Normal external ear canals, both ears  Nose:   Nares normal, septum midline, mucosa normal, no drainage or sinus tenderness  Throat:   Lips, mucosa, and tongue normal; teeth and gums normal  Neck:   Supple, symmetrical, trachea midline, no adenopathy; thyroid: no enlargement/tenderness/nodules; no carotid bruit or JVD  Back:     Symmetric, no curvature, ROM  normal, no CVA tenderness  Lungs:     Clear to auscultation bilaterally, respirations unlabored  Chest Wall:    No tenderness or deformity   Heart:    Regular rate and rhythm, S1 and S2 normal, no murmur, rub or gallop  Breast Exam:    No tenderness, masses, or nipple abnormality  Abdomen:     Soft, non-tender, bowel sounds active all four quadrants, no masses, no organomegaly.    Genitalia:    Pelvic:external genitalia normal, vagina without lesions, discharge, or tenderness. Rectovaginal septum  normal. Cervix normal in appearance, no cervical motion tenderness, no adnexal masses or tenderness.  Uterus normal size, shape, mobile, regular contours, nontender.  Rectal:    Normal external sphincter.  No hemorrhoids appreciated. Internal exam not done.   Extremities:   Extremities normal, atraumatic, no cyanosis or edema  Pulses:   2+ and symmetric all extremities  Skin:   Skin color, texture, turgor normal, no rashes or lesions  Lymph nodes:   Cervical, supraclavicular, and axillary nodes normal   Neurologic:   CNII-XII intact, normal strength, sensation and reflexes throughout   .  Labs:  Lab Results  Component Value Date   TSH 2.170 06/30/2018    Lab Results  Component Value Date   CHOL 208 (H) 04/05/2017   HDL 57 04/05/2017   LDLCALC 125 (H) 04/05/2017   LDLDIRECT 127.0 09/22/2016   TRIG 129 04/05/2017   CHOLHDL 3.6 04/05/2017    Lab Results  Component Value Date   HGBA1C 5.9 (H) 06/30/2018    Lab Results  Component Value Date   TSH 2.170 06/30/2018     CBC and CMP performed 06/30/2019 in [place] (by Rheumatologist), reviewed.   Assessment:   Healthy female exam.  Obesity Class I Diabetes  HTN Rheumatoid Arthritis Perimenopausal symptoms HSV II  Plan:   Labs: HgbA1c and ordered today. Patient notes she has not yet seen her PCP this year.   Breast self exam technique reviewed and patient encouraged to perform self-exam monthly. Contraception: combined OCPs, has discontinued at this time. Still uses condoms when active.  Discussed healthy lifestyle modifications. Mammogram ordered. Pap smear up to date.   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 Chronic hypertension and diabetes managed by primary care provider. Blood pressure mildly elevated today.  Continue prophylaxis with Acyclovir for HSV as indicated. Rheumatoid arthritis managed by Rheumatologist.  Discussion had regarding low energy and hot flushes, patient likely perimenopausal.  Has had an endometrial ablation several years ago so not having cycles. Discussed options for managing symptoms, including bio-identical hormones, herbal supplements, and HRT.  Patient desired to try bio-identical medication Bijuva, but is not covered by her insurance. Will prescribe low dose of Prempro. RTC in 6-8 weeks to reassess symptoms.  Return to clinic in one year for annual exam.   [name], MD [facility]

## 2019-07-14 ENCOUNTER — Encounter: Payer: 59 | Admitting: Obstetrics and Gynecology

## 2019-07-14 LAB — LIPID PANEL
Chol/HDL Ratio: 3.2 ratio (ref 0.0–4.4)
Cholesterol, Total: 198 mg/dL (ref 100–199)
HDL: 62 mg/dL (ref >39)
LDL Chol Calc (NIH): 118 mg/dL — ABNORMAL HIGH (ref 0–99)
Triglycerides: 102 mg/dL (ref 0–149)
VLDL Cholesterol Cal: 18 mg/dL (ref 5–40)

## 2019-07-14 LAB — HEMOGLOBIN A1C
Est. average glucose Bld gHb Est-mCnc: 137 mg/dL
Hgb A1c MFr Bld: 6.4 % — ABNORMAL HIGH (ref 4.8–5.6)

## 2019-08-30 ENCOUNTER — Other Ambulatory Visit: Payer: Self-pay

## 2019-08-30 ENCOUNTER — Encounter: Payer: Self-pay | Admitting: Obstetrics and Gynecology

## 2019-08-30 ENCOUNTER — Ambulatory Visit (INDEPENDENT_AMBULATORY_CARE_PROVIDER_SITE_OTHER): Payer: 59 | Admitting: Obstetrics and Gynecology

## 2019-08-30 VITALS — BP 149/85 | HR 80 | Ht 61.0 in | Wt 174.0 lb

## 2019-08-30 DIAGNOSIS — N951 Menopausal and female climacteric states: Secondary | ICD-10-CM | POA: Diagnosis not present

## 2019-08-30 MED ORDER — PAROXETINE HCL 10 MG PO TABS
10.0000 mg | ORAL_TABLET | Freq: Every day | ORAL | 2 refills | Status: DC
Start: 2019-08-30 — End: 2020-01-16

## 2019-08-30 NOTE — Progress Notes (Signed)
Pt is present for HRT. Pt is currently taking Prempro and stated that it is not helping. Pt stated night sweats has increased and not able to sleep.

## 2019-08-30 NOTE — Progress Notes (Signed)
    GYNECOLOGY PROGRESS NOTE  Subjective:    Patient ID: Mariah Brandt, female    DOB: [birth year], 49 y.o.   MRN: [id number]  HPI  Patient is a 49 y.o. G75P1001 female who presents for f/u of perimenopausal symptoms.  Is currently on Prempro 0.3-1.5 mg dosing.  She notes that she is not having any mood changes or hot flushes, but still noting frequent night sweats, difficulty sleeping (feels like she is not getting restful sleep), and that she is having to pay out of pocket ~ $50. Wonders if there she should resume the use of her birth control, or if there are other options.   The following portions of the patient's history were reviewed and updated as appropriate: allergies, current medications, past family history, past medical history, past social history, past surgical history and problem list.  Review of Systems Pertinent items noted in HPI and remainder of comprehensive ROS otherwise negative.   Objective:   Blood pressure (!) 149/85, pulse 80, height 5' 1"  (1.549 m), weight 174 lb (78.9 kg). General appearance: alert and no distress Remainder of exam deferred.    Assessment:   Perimenopausal symptoms  Plan:   Discussed options of increasing her dose of Prempro, changing to a different hormonal medication, or alternatively a non-hormonal alternative.  Patient unsure. Patient notes she has one more refill of her Prempro. Advised to increase current dosing to 2 tabs. At the end of 2 weeks, if no improvement after increased dose, can try Paxil 10 mg dosing. If new dosing of Prempro works, patient to call in for prescription for new dose.    [name], MD [facility]

## 2019-09-06 ENCOUNTER — Ambulatory Visit
Admission: RE | Admit: 2019-09-06 | Discharge: 2019-09-06 | Disposition: A | Discharge status: Home / Self Care | Payer: 59 | Source: Ambulatory Visit | Attending: Obstetrics and Gynecology | Admitting: Obstetrics and Gynecology

## 2019-09-06 DIAGNOSIS — Z1231 Encounter for screening mammogram for malignant neoplasm of breast: Secondary | ICD-10-CM | POA: Diagnosis not present

## 2019-09-15 ENCOUNTER — Other Ambulatory Visit: Payer: Self-pay

## 2019-09-15 MED ORDER — PREMPRO 0.3-1.5 MG PO TABS: 2.0000 | ORAL_TABLET | Freq: Every day | ORAL | 11 refills | Status: DC

## 2019-09-20 DIAGNOSIS — M0600 Rheumatoid arthritis without rheumatoid factor, unspecified site: Secondary | ICD-10-CM | POA: Diagnosis not present

## 2019-09-20 DIAGNOSIS — Z79899 Other long term (current) drug therapy: Secondary | ICD-10-CM | POA: Diagnosis not present

## 2019-12-18 DIAGNOSIS — M0600 Rheumatoid arthritis without rheumatoid factor, unspecified site: Secondary | ICD-10-CM | POA: Diagnosis not present

## 2019-12-18 DIAGNOSIS — Z79899 Other long term (current) drug therapy: Secondary | ICD-10-CM | POA: Diagnosis not present

## 2020-01-16 ENCOUNTER — Other Ambulatory Visit: Payer: Self-pay

## 2020-01-16 MED ORDER — PAROXETINE HCL 10 MG PO TABS: 10.0000 mg | ORAL_TABLET | Freq: Every day | ORAL | 2 refills | Status: DC

## 2020-02-14 DIAGNOSIS — H524 Presbyopia: Secondary | ICD-10-CM | POA: Diagnosis not present

## 2020-02-14 DIAGNOSIS — H5203 Hypermetropia, bilateral: Secondary | ICD-10-CM | POA: Diagnosis not present

## 2020-02-14 DIAGNOSIS — H52223 Regular astigmatism, bilateral: Secondary | ICD-10-CM | POA: Diagnosis not present

## 2020-02-14 DIAGNOSIS — H04123 Dry eye syndrome of bilateral lacrimal glands: Secondary | ICD-10-CM | POA: Diagnosis not present

## 2020-02-14 DIAGNOSIS — Z135 Encounter for screening for eye and ear disorders: Secondary | ICD-10-CM | POA: Diagnosis not present

## 2020-03-20 DIAGNOSIS — M7712 Lateral epicondylitis, left elbow: Secondary | ICD-10-CM | POA: Diagnosis not present

## 2020-03-20 DIAGNOSIS — M0600 Rheumatoid arthritis without rheumatoid factor, unspecified site: Secondary | ICD-10-CM | POA: Diagnosis not present

## 2020-03-20 DIAGNOSIS — Z79899 Other long term (current) drug therapy: Secondary | ICD-10-CM | POA: Diagnosis not present

## 2020-06-25 ENCOUNTER — Other Ambulatory Visit (HOSPITAL_COMMUNITY): Payer: Self-pay | Admitting: Rheumatology

## 2020-06-25 DIAGNOSIS — Z79899 Other long term (current) drug therapy: Secondary | ICD-10-CM | POA: Diagnosis not present

## 2020-06-25 DIAGNOSIS — M7712 Lateral epicondylitis, left elbow: Secondary | ICD-10-CM | POA: Diagnosis not present

## 2020-06-25 DIAGNOSIS — M0600 Rheumatoid arthritis without rheumatoid factor, unspecified site: Secondary | ICD-10-CM | POA: Diagnosis not present

## 2020-07-15 NOTE — Patient Instructions (Addendum)
Preventive Care 40-47 Years Old, Female Preventive care refers to visits with your health care provider and lifestyle choices that can promote health and wellness. This includes:  A yearly physical exam. This may also be called an annual well check.  Regular dental visits and eye exams.  Immunizations.  Screening for certain conditions.  Healthy lifestyle choices, such as eating a healthy diet, getting regular exercise, not using drugs or products that contain nicotine and tobacco, and limiting alcohol use. What can I expect for my preventive care visit? Physical exam Your health care provider will check your:  Height and weight. This may be used to calculate body mass index (BMI), which tells if you are at a healthy weight.  Heart rate and blood pressure.  Skin for abnormal spots. Counseling Your health care provider may ask you questions about your:  Alcohol, tobacco, and drug use.  Emotional well-being.  Home and relationship well-being.  Sexual activity.  Eating habits.  Work and work environment.  Method of birth control.  Menstrual cycle.  Pregnancy history. What immunizations do I need?  Influenza (flu) vaccine  This is recommended every year. Tetanus, diphtheria, and pertussis (Tdap) vaccine  You may need a Td booster every 10 years. Varicella (chickenpox) vaccine  You may need this if you have not been vaccinated. Zoster (shingles) vaccine  You may need this after age 60. Measles, mumps, and rubella (MMR) vaccine  You may need at least one dose of MMR if you were born in 1957 or later. You may also need a second dose. Pneumococcal conjugate (PCV13) vaccine  You may need this if you have certain conditions and were not previously vaccinated. Pneumococcal polysaccharide (PPSV23) vaccine  You may need one or two doses if you smoke cigarettes or if you have certain conditions. Meningococcal conjugate (MenACWY) vaccine  You may need this if you  have certain conditions. Hepatitis A vaccine  You may need this if you have certain conditions or if you travel or work in places where you may be exposed to hepatitis A. Hepatitis B vaccine  You may need this if you have certain conditions or if you travel or work in places where you may be exposed to hepatitis B. Haemophilus influenzae type b (Hib) vaccine  You may need this if you have certain conditions. Human papillomavirus (HPV) vaccine  If recommended by your health care provider, you may need three doses over 6 months. You may receive vaccines as individual doses or as more than one vaccine together in one shot (combination vaccines). Talk with your health care provider about the risks and benefits of combination vaccines. What tests do I need? Blood tests  Lipid and cholesterol levels. These may be checked every 5 years, or more frequently if you are over 50 years old.  Hepatitis C test.  Hepatitis B test. Screening  Lung cancer screening. You may have this screening every year starting at age 55 if you have a 30-pack-year history of smoking and currently smoke or have quit within the past 15 years.  Colorectal cancer screening. All adults should have this screening starting at age 50 and continuing until age 75. Your health care provider may recommend screening at age 45 if you are at increased risk. You will have tests every 1-10 years, depending on your results and the type of screening test.  Diabetes screening. This is done by checking your blood sugar (glucose) after you have not eaten for a while (fasting). You may have this   done every 1-3 years.  Mammogram. This may be done every 1-2 years. Talk with your health care provider about when you should start having regular mammograms. This may depend on whether you have a family history of breast cancer.  BRCA-related cancer screening. This may be done if you have a family history of breast, ovarian, tubal, or peritoneal  cancers.  Pelvic exam and Pap test. This may be done every 3 years starting at age 60. Starting at age 7, this may be done every 5 years if you have a Pap test in combination with an HPV test. Other tests  Sexually transmitted disease (STD) testing.  Bone density scan. This is done to screen for osteoporosis. You may have this scan if you are at high risk for osteoporosis. Follow these instructions at home: Eating and drinking  Eat a diet that includes fresh fruits and vegetables, whole grains, lean protein, and low-fat dairy.  Take vitamin and mineral supplements as recommended by your health care provider.  Do not drink alcohol if: ? Your health care provider tells you not to drink. ? You are pregnant, may be pregnant, or are planning to become pregnant.  If you drink alcohol: ? Limit how much you have to 0-1 drink a day. ? Be aware of how much alcohol is in your drink. In the [place] one drink equals one 12 oz bottle of beer (355 mL), one 5 oz glass of wine (148 mL), or one 1 oz glass of hard liquor (44 mL). Lifestyle  Take daily care of your teeth and gums.  Stay active. Exercise for at least 30 minutes on 5 or more days each week.  Do not use any products that contain nicotine or tobacco, such as cigarettes, e-cigarettes, and chewing tobacco. If you need help quitting, ask your health care provider.  If you are sexually active, practice safe sex. Use a condom or other form of birth control (contraception) in order to prevent pregnancy and STIs (sexually transmitted infections).  If told by your health care provider, take low-dose aspirin daily starting at age 48. What's next?  Visit your health care provider once a year for a well check visit.  Ask your health care provider how often you should have your eyes and teeth checked.  Stay up to date on all vaccines. This information is not intended to replace advice given to you by your health care provider. Make sure you  discuss any questions you have with your health care provider. Document Revised: 04/07/2018 Document Reviewed: 04/07/2018 Elsevier Patient Education  2020 [place] Breast self-awareness is knowing how your breasts look and feel. Doing breast self-awareness is important. It allows you to catch a breast problem early while it is still small and can be treated. All women should do breast self-awareness, including women who have had breast implants. Tell your doctor if you notice a change in your breasts. What you need:  A mirror.  A well-lit room. How to do a breast self-exam A breast self-exam is one way to learn what is normal for your breasts and to check for changes. To do a breast self-exam: Look for changes  1. Take off all the clothes above your waist. 2. Stand in front of a mirror in a room with good lighting. 3. Put your hands on your hips. 4. Push your hands down. 5. Look at your breasts and nipples in the mirror to see if one breast or nipple looks different from the  other. Check to see if: ? The shape of one breast is different. ? The size of one breast is different. ? There are wrinkles, dips, and bumps in one breast and not the other. 6. Look at each breast for changes in the skin, such as: ? Redness. ? Scaly areas. 7. Look for changes in your nipples, such as: ? Liquid around the nipples. ? Bleeding. ? Dimpling. ? Redness. ? A change in where the nipples are. Feel for changes  1. Lie on your back on the floor. 2. Feel each breast. To do this, follow these steps: ? Pick a breast to feel. ? Put the arm closest to that breast above your head. ? Use your other arm to feel the nipple area of your breast. Feel the area with the pads of your three middle fingers by making small circles with your fingers. For the first circle, press lightly. For the second circle, press harder. For the third circle, press even harder. ? Keep making circles with  your fingers at the different pressures as you move down your breast. Stop when you feel your ribs. ? Move your fingers a little toward the center of your body. ? Start making circles with your fingers again, this time going up until you reach your collarbone. ? Keep making up-and-down circles until you reach your armpit. Remember to keep using the three pressures. ? Feel the other breast in the same way. 3. Sit or stand in the tub or shower. 4. With soapy water on your skin, feel each breast the same way you did in step 2 when you were lying on the floor. Write down what you find Writing down what you find can help you remember what to tell your doctor. Write down:  What is normal for each breast.  Any changes you find in each breast, including: ? The kind of changes you find. ? Whether you have pain. ? Size and location of any lumps.  When you last had your menstrual period. General tips  Check your breasts every month.  If you are breastfeeding, the best time to check your breasts is after you feed your baby or after you use a breast pump.  If you get menstrual periods, the best time to check your breasts is 5-7 days after your menstrual period is over.  With time, you will become comfortable with the self-exam, and you will begin to know if there are changes in your breasts. Contact a doctor if you:  See a change in the shape or size of your breasts or nipples.  See a change in the skin of your breast or nipples, such as red or scaly skin.  Have fluid coming from your nipples that is not normal.  Find a lump or thick area that was not there before.  Have pain in your breasts.  Have any concerns about your breast health. Summary  Breast self-awareness includes looking for changes in your breasts, as well as feeling for changes within your breasts.  Breast self-awareness should be done in front of a mirror in a well-lit room.  You should check your breasts every month.  If you get menstrual periods, the best time to check your breasts is 5-7 days after your menstrual period is over.  Let your doctor know of any changes you see in your breasts, including changes in size, changes on the skin, pain or tenderness, or fluid from your nipples that is not normal. This information is not  intended to replace advice given to you by your health care provider. Make sure you discuss any questions you have with your health care provider. Document Revised: 03/15/2018 Document Reviewed: 03/15/2018 Elsevier Patient Education  [place].   Hypertension, Adult Hypertension is another name for high blood pressure. High blood pressure forces your heart to work harder to pump blood. This can cause problems over time. There are two numbers in a blood pressure reading. There is a top number (systolic) over a bottom number (diastolic). It is best to have a blood pressure that is below 120/80. Healthy choices can help lower your blood pressure, or you may need medicine to help lower it. What are the causes? The cause of this condition is not known. Some conditions may be related to high blood pressure. What increases the risk?  Smoking.  Having type 2 diabetes mellitus, high cholesterol, or both.  Not getting enough exercise or physical activity.  Being overweight.  Having too much fat, sugar, calories, or salt (sodium) in your diet.  Drinking too much alcohol.  Having long-term (chronic) kidney disease.  Having a family history of high blood pressure.  Age. Risk increases with age.  Race. You may be at higher risk if you are African American.  Gender. Men are at higher risk than women before age 81. After age 36, women are at higher risk than men.  Having obstructive sleep apnea.  Stress. What are the signs or symptoms?  High blood pressure may not cause symptoms. Very high blood pressure (hypertensive crisis) may cause: ? Headache. ? Feelings of worry  or nervousness (anxiety). ? Shortness of breath. ? Nosebleed. ? A feeling of being sick to your stomach (nausea). ? Throwing up (vomiting). ? Changes in how you see. ? Very bad chest pain. ? Seizures. How is this treated?  This condition is treated by making healthy lifestyle changes, such as: ? Eating healthy foods. ? Exercising more. ? Drinking less alcohol.  Your health care provider may prescribe medicine if lifestyle changes are not enough to get your blood pressure under control, and if: ? Your top number is above 130. ? Your bottom number is above 80.  Your personal target blood pressure may vary. Follow these instructions at home: Eating and drinking   If told, follow the DASH eating plan. To follow this plan: ? Fill one half of your plate at each meal with fruits and vegetables. ? Fill one fourth of your plate at each meal with whole grains. Whole grains include whole-wheat pasta, brown rice, and whole-grain bread. ? Eat or drink low-fat dairy products, such as skim milk or low-fat yogurt. ? Fill one fourth of your plate at each meal with low-fat (lean) proteins. Low-fat proteins include fish, chicken without skin, eggs, beans, and tofu. ? Avoid fatty meat, cured and processed meat, or chicken with skin. ? Avoid pre-made or processed food.  Eat less than 1,500 mg of salt each day.  Do not drink alcohol if: ? Your doctor tells you not to drink. ? You are pregnant, may be pregnant, or are planning to become pregnant.  If you drink alcohol: ? Limit how much you use to:  0-1 drink a day for women.  0-2 drinks a day for men. ? Be aware of how much alcohol is in your drink. In the [place] one drink equals one 12 oz bottle of beer (355 mL), one 5 oz glass of wine (148 mL), or one 1 oz glass of  hard liquor (44 mL). Lifestyle   Work with your doctor to stay at a healthy weight or to lose weight. Ask your doctor what the best weight is for you.  Get at least 30 minutes  of exercise most days of the week. This may include walking, swimming, or biking.  Get at least 30 minutes of exercise that strengthens your muscles (resistance exercise) at least 3 days a week. This may include lifting weights or doing Pilates.  Do not use any products that contain nicotine or tobacco, such as cigarettes, e-cigarettes, and chewing tobacco. If you need help quitting, ask your doctor.  Check your blood pressure at home as told by your doctor.  Keep all follow-up visits as told by your doctor. This is important. Medicines  Take over-the-counter and prescription medicines only as told by your doctor. Follow directions carefully.  Do not skip doses of blood pressure medicine. The medicine does not work as well if you skip doses. Skipping doses also puts you at risk for problems.  Ask your doctor about side effects or reactions to medicines that you should watch for. Contact a doctor if you:  Think you are having a reaction to the medicine you are taking.  Have headaches that keep coming back (recurring).  Feel dizzy.  Have swelling in your ankles.  Have trouble with your vision. Get help right away if you:  Get a very bad headache.  Start to feel mixed up (confused).  Feel weak or numb.  Feel faint.  Have very bad pain in your: ? Chest. ? Belly (abdomen).  Throw up more than once.  Have trouble breathing. Summary  Hypertension is another name for high blood pressure.  High blood pressure forces your heart to work harder to pump blood.  For most people, a normal blood pressure is less than 120/80.  Making healthy choices can help lower blood pressure. If your blood pressure does not get lower with healthy choices, you may need to take medicine. This information is not intended to replace advice given to you by your health care provider. Make sure you discuss any questions you have with your health care provider. Document Revised: 04/06/2018 Document  Reviewed: 04/06/2018 Elsevier Patient Education  2020 [employer].

## 2020-07-16 NOTE — Progress Notes (Signed)
Pt present for annual exam. Pt stated that she was doing well no problems. Pt's bp was elevated bp 148/94  rechecked about 10 minutes later bp reading 138/95.

## 2020-07-17 ENCOUNTER — Ambulatory Visit (INDEPENDENT_AMBULATORY_CARE_PROVIDER_SITE_OTHER): Payer: 59 | Admitting: Obstetrics and Gynecology

## 2020-07-17 ENCOUNTER — Other Ambulatory Visit: Payer: Self-pay

## 2020-07-17 ENCOUNTER — Other Ambulatory Visit: Payer: Self-pay | Admitting: Obstetrics and Gynecology

## 2020-07-17 ENCOUNTER — Encounter: Payer: Self-pay | Admitting: Obstetrics and Gynecology

## 2020-07-17 VITALS — BP 148/94 | HR 83 | Ht 61.0 in | Wt 173.8 lb

## 2020-07-17 DIAGNOSIS — Z1231 Encounter for screening mammogram for malignant neoplasm of breast: Secondary | ICD-10-CM | POA: Diagnosis not present

## 2020-07-17 DIAGNOSIS — I10 Essential (primary) hypertension: Secondary | ICD-10-CM | POA: Diagnosis not present

## 2020-07-17 DIAGNOSIS — L680 Hirsutism: Secondary | ICD-10-CM | POA: Diagnosis not present

## 2020-07-17 DIAGNOSIS — N946 Dysmenorrhea, unspecified: Secondary | ICD-10-CM

## 2020-07-17 DIAGNOSIS — E1169 Type 2 diabetes mellitus with other specified complication: Secondary | ICD-10-CM

## 2020-07-17 DIAGNOSIS — Z01419 Encounter for gynecological examination (general) (routine) without abnormal findings: Secondary | ICD-10-CM | POA: Diagnosis not present

## 2020-07-17 DIAGNOSIS — Z1211 Encounter for screening for malignant neoplasm of colon: Secondary | ICD-10-CM

## 2020-07-17 DIAGNOSIS — E282 Polycystic ovarian syndrome: Secondary | ICD-10-CM

## 2020-07-17 DIAGNOSIS — N951 Menopausal and female climacteric states: Secondary | ICD-10-CM

## 2020-07-17 DIAGNOSIS — Z83719 Family history of colon polyps, unspecified: Secondary | ICD-10-CM

## 2020-07-17 DIAGNOSIS — E669 Obesity, unspecified: Secondary | ICD-10-CM

## 2020-07-17 DIAGNOSIS — E1159 Type 2 diabetes mellitus with other circulatory complications: Secondary | ICD-10-CM

## 2020-07-17 DIAGNOSIS — Z8371 Family history of colonic polyps: Secondary | ICD-10-CM

## 2020-07-17 DIAGNOSIS — I152 Hypertension secondary to endocrine disorders: Secondary | ICD-10-CM

## 2020-07-17 MED ORDER — SPIRONOLACTONE 50 MG PO TABS: 50.0000 mg | ORAL_TABLET | Freq: Two times a day (BID) | ORAL | 2 refills | Status: DC

## 2020-07-17 MED ORDER — PAROXETINE HCL 10 MG PO TABS: 10.0000 mg | ORAL_TABLET | Freq: Every day | ORAL | 3 refills | Status: DC

## 2020-07-17 NOTE — Progress Notes (Signed)
GYNECOLOGY ANNUAL PHYSICAL EXAM PROGRESS NOTE  Subjective:    Mariah Brandt is a 49 y.o. G1P1001 separated female who presents for an annual exam.  The patient is sexually active. The patient wears seatbelts: yes. The patient participates in regular exercise: no. Has the patient ever been transfused or tattooed?: no. The patient reports that there is not domestic violence in her life.   The patient has the following concerns today:  1. Notes that she has separated from her husband beginning in April.  Is doing well. Notes it was for the best.  Plans to file for divorce this week.  2. Reports that she still has menstrual cycles almost every other month.  Has a prior history of endometrial ablation several years ago. Reports cycles are usually light flow, and may last anywhere from 3-5 days, however are becoming more and more painful.  Pain usually lasts ~ 1 day, Aleve helps to manage the pain.  3. Desires refill on Paxil.  Notes that she stopped taking the Prempro due to cost earlier this year. Paxil has really helped her vasomotor symptoms.  4. Also reports issues with increasing facial hair growth.  Has a h/o PCOS, has not been on any medications for several years.  Reports remote h/o use of Spironolactone, but remembers having some issues with potassium.    Gynecologic History  Menarche age: 30 No LMP recorded. Patient has had an ablation.  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  Last Pap: 06/2018. Results were: normal.  Denies h/o abnormal pap smears. Last mammogram: 09/06/2019. Results were: normal Last colonoscopy: has never had one.   OB History  Gravida Para Term Preterm AB Living  1 1 1  0 0 1  SAB TAB Ectopic Multiple Live Births  0 0 0 0 1    # Outcome Date GA Lbr Len/2nd Weight Sex Delivery Anes PTL Lv  1 Term 1995 [redacted]w[redacted]d 6 lb 15 oz (3.147 kg) M Vag-Spont   LIV    Past Medical History:  Diagnosis  Date  . Anxiety   . Arthritis   . [place]thritis, rheumatoid ([place]   . Constipation, chronic 1996   after chloecystetomy  . Diabetes mellitus without complication ([place]   . Dysmenorrhea   . Essential hypertension 06/09/2015  . Inflammatory arthritis   . Menorrhagia   . Obesity (BMI 30-39.9)     Past Surgical History:  Procedure Laterality Date  . CHOLECYSTECTOMY  1996  . DILITATION & CURRETTAGE/HYSTROSCOPY WITH NOVASURE ABLATION N/A 04/29/2015   Procedure: DILATATION & CURETTAGE/HYSTEROSCOPY WITH NOVASURE ABLATION;  Surgeon: ARubie Maid MD;  Location: [facility] ORS;  Service: Gynecology;  Laterality: N/A;    Family History  Problem Relation Age of Onset  . Hyperlipidemia Mother   . Fibroids Mother   . Thyroid disease Mother   . Heart disease Father   . Diabetes Paternal Aunt   . Diabetes Maternal Grandmother   . Breast cancer Neg Hx     Social History   Socioeconomic History  . Marital status: Legally Separated    Spouse name: Not on file  . Number of children: Not on file  . Years of education: Not on file  . Highest education level: Not on file  Occupational History  . Not on file  Tobacco Use  . Smoking status: Never Smoker  . Smokeless tobacco: Never Used  Vaping Use  . Vaping Use: Never used  Substance and  Sexual Activity  . Alcohol use: No  . Drug use: No  . Sexual activity: Not Currently  Other Topics Concern  . Not on file  Social History Narrative  . Not on file   Social Determinants of Health   Financial Resource Strain:   . Difficulty of Paying Living Expenses: Not on file  Food Insecurity:   . Worried About Charity fundraiser in the Last Year: Not on file  . Ran Out of Food in the Last Year: Not on file  Transportation Needs:   . Lack of Transportation (Medical): Not on file  . Lack of Transportation (Non-Medical): Not on file  Physical Activity:   . Days of Exercise per Week: Not on file  . Minutes of Exercise per Session: Not on file  Stress:    . Feeling of Stress : Not on file  Social Connections:   . Frequency of Communication with Friends and Family: Not on file  . Frequency of Social Gatherings with Friends and Family: Not on file  . Attends Religious Services: Not on file  . Active Member of Clubs or Organizations: Not on file  . Attends Archivist Meetings: Not on file  . Marital Status: Not on file  Intimate Partner Violence:   . Fear of Current or Ex-Partner: Not on file  . Emotionally Abused: Not on file  . Physically Abused: Not on file  . Sexually Abused: Not on file    Current Outpatient Medications on File Prior to Visit  Medication Sig Dispense Refill  . FOLIC ACID PO Take by mouth.    . hydroxychloroquine (PLAQUENIL) 200 MG tablet Take by mouth daily.    . methotrexate (RHEUMATREX) 7.5 MG tablet Take 7.5 mg by mouth once a week. Caution" Chemotherapy. Protect from light.    Marland Kitchen PARoxetine (PAXIL) 10 MG tablet Take 1 tablet (10 mg total) by mouth daily. 30 tablet 2  . valACYclovir (VALTREX) 1000 MG tablet Take 1 tablet (1,000 mg total) by mouth 3 (three) times daily. (Patient taking differently: Take 1,000 mg by mouth 3 (three) times daily. As needed) 21 tablet 1   No current facility-administered medications on file prior to visit.    No Known Allergies   Review of Systems Constitutional: negative for chills, fatigue, fevers, unintententional weight loss or gain. Positive for hot flushes and night sweats (managed with Paxil).  Eyes: negative for irritation, redness and visual disturbance Ears, nose, mouth, throat, and face: negative for hearing loss, nasal congestion, snoring and tinnitus Respiratory: negative for asthma, cough, sputum Cardiovascular: negative for chest pain, dyspnea, exertional chest pressure/discomfort, irregular heart beat, palpitations and syncope Gastrointestinal: negative for abdominal pain, change in bowel habits, nausea and vomiting Genitourinary:Negative for abnormal  menstrual periods, genital lesions, sexual problems and vaginal discharge, dysuria and urinary incontinence. Positive for painful menses. Integument/breast: negative for breast lump, breast tenderness and nipple discharge Hematologic/lymphatic: negative for bleeding and easy bruising Musculoskeletal:negative for back pain and muscle weakness Neurological: negative for dizziness, headaches, vertigo and weakness Endocrine: negative for diabetic symptoms including polydipsia, polyuria and skin dryness Allergic/Immunologic: negative for hay fever and urticaria      Objective:  Blood pressure (!) 138/95, pulse 79, height 5' 1"  (1.549 m), weight 173 lb 12.8 oz (78.8 kg). Body mass index is 32.84 kg/m.  Repeat BP 148/94.   General Appearance:    Alert, cooperative, no distress, appears stated age, mildly obese  Head:    Normocephalic, without obvious abnormality, atraumatic  Eyes:  PERRL, conjunctiva/corneas clear, EOM's intact, both eyes  Ears:    Normal external ear canals, both ears  Nose:   Nares normal, septum midline, mucosa normal, no drainage or sinus tenderness  Throat:   Lips, mucosa, and tongue normal; teeth and gums normal  Neck:   Supple, symmetrical, trachea midline, no adenopathy; thyroid: no enlargement/tenderness/nodules; no carotid bruit or JVD  Back:     Symmetric, no curvature, ROM normal, no CVA tenderness  Lungs:     Clear to auscultation bilaterally, respirations unlabored  Chest Wall:    No tenderness or deformity   Heart:    Regular rate and rhythm, S1 and S2 normal, no murmur, rub or gallop  Breast Exam:    No tenderness, masses, or nipple abnormality  Abdomen:     Soft, non-tender, bowel sounds active all four quadrants, no masses, no organomegaly.    Genitalia:    Pelvic:external genitalia normal, vagina without lesions, or tenderness. Scant brown blood in vaginal vault. Rectovaginal septum  normal. Cervix normal in appearance, no cervical motion tenderness, no  adnexal masses or tenderness.  Uterus normal size, shape, mobile, regular contours, nontender.  Rectal:    Normal external sphincter.  No hemorrhoids appreciated. Internal exam not done.   Extremities:   Extremities normal, atraumatic, no cyanosis or edema  Pulses:   2+ and symmetric all extremities  Skin:   Skin color, texture, turgor normal, no rashes or lesions. Increased facial hair noted along chin line.   Lymph nodes:   Cervical, supraclavicular, and axillary nodes normal  Neurologic:   CNII-XII intact, normal strength, sensation and reflexes throughout   .  Labs:  Lab Results  Component Value Date   TSH 2.170 06/30/2018    Lab Results  Component Value Date   CHOL 198 07/13/2019   HDL 62 07/13/2019   LDLCALC 118 (H) 07/13/2019   LDLDIRECT 127.0 09/22/2016   TRIG 102 07/13/2019   CHOLHDL 3.2 07/13/2019    Lab Results  Component Value Date   HGBA1C 6.4 (H) 07/13/2019    Lab Results  Component Value Date   TSH 2.170 06/30/2018     Other more recent labs in [place] (performed by Rheumatologist last month), reviewed.   Assessment:   Routine gynecologic exam Obesity Class I Diabetes  HTN Perimenopausal symptoms PCOS with hirsutism Family history of colon polyps Dysmenorrhea  Plan:   - Labs: None ordered. Patient encouraged to f/u with PCP. Also has labs with Rheumatologist every 3 months. Labs in Care Everywhere reviewed.  - Breast self exam technique reviewed and patient encouraged to perform self-exam monthly. - Contraception: abstinent.  - Discussed healthy lifestyle modifications. - Mammogram up to date, ordered for January. - Pap smear up to date.  Repeat in 1 year.  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 - Chronic hypertension and diabetes managed by primary care provider. - Blood pressure mildly elevated today. Not on any meds.  - Perimenopausal symptoms managed with Paxil. Will give refill.  - Discussed colon cancer  screening, patient with mother with h/o benign polyp x 1 time. Desires to hold off on colonoscopy until age 81. Is willing to perform Cologard 1 time until then.  - Discussed option of use of medications/creams for hirsutism.  Would like to reinitiate spironolactone as it may also help with her blood pressures.  Advised that we would again need to follow her potassium levels. Patient notes understanding.  Will have potassium levels checked with next  visit with Rheumatologist as she will be having labs drawn.  - Dysmenorrhea, managed with Aleve.  If symptoms become worse or bleeding becomes heavy, advised to return for further management. - Return to clinic in one year for annual exam. RTC in 3 months to f/u hirsutism.    [name], MD [facility]

## 2020-10-15 ENCOUNTER — Encounter: Payer: 59 | Admitting: Obstetrics and Gynecology

## 2020-10-15 ENCOUNTER — Other Ambulatory Visit: Payer: Self-pay

## 2020-11-01 ENCOUNTER — Other Ambulatory Visit: Payer: Self-pay

## 2020-11-01 ENCOUNTER — Other Ambulatory Visit: Payer: Self-pay | Admitting: Obstetrics and Gynecology

## 2020-11-01 MED ORDER — SPIRONOLACTONE 50 MG PO TABS: 50.0000 mg | ORAL_TABLET | Freq: Two times a day (BID) | ORAL | 2 refills | Status: DC

## 2020-11-05 ENCOUNTER — Ambulatory Visit: Payer: 59 | Admitting: Obstetrics and Gynecology

## 2020-11-05 ENCOUNTER — Encounter: Payer: Self-pay | Admitting: Obstetrics and Gynecology

## 2020-11-05 ENCOUNTER — Other Ambulatory Visit: Payer: Self-pay

## 2020-11-05 VITALS — BP 130/86 | HR 102 | Ht 61.0 in | Wt 179.3 lb

## 2020-11-05 DIAGNOSIS — E1169 Type 2 diabetes mellitus with other specified complication: Secondary | ICD-10-CM | POA: Diagnosis not present

## 2020-11-05 DIAGNOSIS — E669 Obesity, unspecified: Secondary | ICD-10-CM

## 2020-11-05 DIAGNOSIS — Z13220 Encounter for screening for lipoid disorders: Secondary | ICD-10-CM

## 2020-11-05 DIAGNOSIS — E1159 Type 2 diabetes mellitus with other circulatory complications: Secondary | ICD-10-CM

## 2020-11-05 DIAGNOSIS — L680 Hirsutism: Secondary | ICD-10-CM

## 2020-11-05 DIAGNOSIS — I152 Hypertension secondary to endocrine disorders: Secondary | ICD-10-CM

## 2020-11-05 DIAGNOSIS — E282 Polycystic ovarian syndrome: Secondary | ICD-10-CM | POA: Diagnosis not present

## 2020-11-05 NOTE — Progress Notes (Signed)
Pt present for follow up for hirsutism and bp check.

## 2020-11-05 NOTE — Progress Notes (Signed)
    GYNECOLOGY PROGRESS NOTE  Subjective:    Patient ID: Mariah Brandt, female    DOB: October 19, 1970, 50 y.o.   MRN: [id number]  HPI  Patient is a 50 y.o. G84P1001 female who presents for follow up of hirsutism. Has a h/o PCOS. Resumed spironolactone approximately 3 months ago due to increase in hair growth.  Denies complaints today.  Patient does note that the hair growth has slowed down tremendously.  She also reports that she has not had a cycle since resuming the spironolactone and is happy about this.  Of note, patient notes that she was unable to get the requested labs drawn to follow-up with her potassium and cholesterol and sugar levels with her Rheumatologist as her visit was moved.  Appointment is approximately 2 weeks away.  The following portions of the patient's history were reviewed and updated as appropriate: allergies, current medications, past family history, past medical history, past social history, past surgical history and problem list.  Review of Systems Pertinent items noted in HPI and remainder of comprehensive ROS otherwise negative.   Objective:   Blood pressure 130/86, pulse (!) 102, height 5' 1"  (1.549 m), weight 179 lb 4.8 oz (81.3 kg). Body mass index is 33.88 kg/m.  General appearance: alert and no distress Skin: no sores or suspicious lesions or rashes or color changes.  No visible facial hair today.    Assessment:   1. Hirsutism   2. PCOS (polycystic ovarian syndrome)   3. Screening for lipid disorders   4. Obesity, diabetes, and hypertension syndrome ([place])    Plan:   1. Can continue Spironolactone as improvement is noted in her symptoms.  Currently using 50 mg BID.  BP is tolerating current dosing. 2.  Attempted to draw patient's blood today however lab closed.  Will have patient have labs drawn when she sees her rheumatologist in 2 weeks. 3.  Return to clinic for any scheduled appointments or for any gynecologic concerns as needed.     [name], MD [facility]

## 2020-11-11 ENCOUNTER — Other Ambulatory Visit: Payer: Self-pay

## 2020-11-11 ENCOUNTER — Ambulatory Visit
Admission: RE | Admit: 2020-11-11 | Discharge: 2020-11-11 | Disposition: A | Discharge status: Home / Self Care | Payer: 59 | Source: Ambulatory Visit | Attending: Obstetrics and Gynecology | Admitting: Obstetrics and Gynecology

## 2020-11-11 DIAGNOSIS — Z1231 Encounter for screening mammogram for malignant neoplasm of breast: Secondary | ICD-10-CM | POA: Diagnosis not present

## 2020-11-19 ENCOUNTER — Other Ambulatory Visit (HOSPITAL_COMMUNITY): Payer: Self-pay

## 2020-11-19 DIAGNOSIS — Z79899 Other long term (current) drug therapy: Secondary | ICD-10-CM | POA: Diagnosis not present

## 2020-11-19 DIAGNOSIS — M0600 Rheumatoid arthritis without rheumatoid factor, unspecified site: Secondary | ICD-10-CM | POA: Diagnosis not present

## 2020-11-19 MED ORDER — HYDROXYCHLOROQUINE SULFATE 200 MG PO TABS
200.0000 mg | ORAL_TABLET | Freq: Two times a day (BID) | ORAL | 1 refills | Status: DC
  Filled 2020-11-19: qty 180, 90d supply, fill #0

## 2020-11-19 MED ORDER — METHOTREXATE 2.5 MG PO TABS
17.5000 mg | ORAL_TABLET | ORAL | 1 refills | Status: DC
  Filled 2020-11-19 – 2021-01-24 (×2): qty 84, 84d supply, fill #0
  Filled 2021-06-27: qty 84, 84d supply, fill #1

## 2020-11-19 MED ORDER — FOLIC ACID 1 MG PO TABS
1.0000 mg | ORAL_TABLET | Freq: Every day | ORAL | 4 refills | Status: AC
  Filled 2020-11-19: qty 90, 90d supply, fill #0

## 2020-11-28 ENCOUNTER — Other Ambulatory Visit (HOSPITAL_COMMUNITY): Payer: Self-pay

## 2020-12-02 ENCOUNTER — Other Ambulatory Visit: Payer: 59

## 2020-12-02 ENCOUNTER — Other Ambulatory Visit: Payer: Self-pay

## 2020-12-02 DIAGNOSIS — L680 Hirsutism: Secondary | ICD-10-CM | POA: Diagnosis not present

## 2020-12-02 DIAGNOSIS — E1159 Type 2 diabetes mellitus with other circulatory complications: Secondary | ICD-10-CM | POA: Diagnosis not present

## 2020-12-02 DIAGNOSIS — E1169 Type 2 diabetes mellitus with other specified complication: Secondary | ICD-10-CM | POA: Diagnosis not present

## 2020-12-02 DIAGNOSIS — E282 Polycystic ovarian syndrome: Secondary | ICD-10-CM

## 2020-12-02 DIAGNOSIS — E669 Obesity, unspecified: Secondary | ICD-10-CM | POA: Diagnosis not present

## 2020-12-02 DIAGNOSIS — Z13220 Encounter for screening for lipoid disorders: Secondary | ICD-10-CM | POA: Diagnosis not present

## 2020-12-02 DIAGNOSIS — I152 Hypertension secondary to endocrine disorders: Secondary | ICD-10-CM | POA: Diagnosis not present

## 2020-12-03 ENCOUNTER — Other Ambulatory Visit (HOSPITAL_COMMUNITY): Payer: Self-pay

## 2020-12-03 LAB — COMPREHENSIVE METABOLIC PANEL
ALT: 10 IU/L (ref 0–32)
AST: 14 IU/L (ref 0–40)
Albumin/Globulin Ratio: 1.5 (ref 1.2–2.2)
Albumin: 4.6 g/dL (ref 3.8–4.8)
Alkaline Phosphatase: 91 IU/L (ref 44–121)
BUN/Creatinine Ratio: 10 (ref 9–23)
BUN: 9 mg/dL (ref 6–24)
Bilirubin Total: 0.7 mg/dL (ref 0.0–1.2)
CO2: 21 mmol/L (ref 20–29)
Calcium: 9.4 mg/dL (ref 8.7–10.2)
Chloride: 102 mmol/L (ref 96–106)
Creatinine, Ser: 0.91 mg/dL (ref 0.57–1.00)
Globulin, Total: 3 g/dL (ref 1.5–4.5)
Glucose: 107 mg/dL — ABNORMAL HIGH (ref 65–99)
Potassium: 3.9 mmol/L (ref 3.5–5.2)
Sodium: 139 mmol/L (ref 134–144)
Total Protein: 7.6 g/dL (ref 6.0–8.5)
eGFR: 78 mL/min/{1.73_m2} (ref >59)

## 2020-12-03 LAB — HEMOGLOBIN A1C
Est. average glucose Bld gHb Est-mCnc: 151 mg/dL
Hgb A1c MFr Bld: 6.9 % — ABNORMAL HIGH (ref 4.8–5.6)

## 2020-12-03 LAB — LIPID PANEL
Chol/HDL Ratio: 3.5 ratio (ref 0.0–4.4)
Cholesterol, Total: 183 mg/dL (ref 100–199)
HDL: 52 mg/dL (ref >39)
LDL Chol Calc (NIH): 114 mg/dL — ABNORMAL HIGH (ref 0–99)
Triglycerides: 96 mg/dL (ref 0–149)
VLDL Cholesterol Cal: 17 mg/dL (ref 5–40)

## 2020-12-03 MED ORDER — GLIPIZIDE 5 MG PO TABS
5.0000 mg | ORAL_TABLET | Freq: Every day | ORAL | 3 refills | Status: DC
  Filled 2020-12-03: qty 90, 90d supply, fill #0
  Filled 2021-05-22: qty 90, 90d supply, fill #1

## 2020-12-10 ENCOUNTER — Other Ambulatory Visit (HOSPITAL_COMMUNITY): Payer: Self-pay

## 2020-12-10 MED FILL — Spironolactone Tab 50 MG: ORAL | 30 days supply | Qty: 120 | Fill #0 | Status: AC

## 2020-12-10 MED FILL — Paroxetine HCl Tab 10 MG: ORAL | 90 days supply | Qty: 90 | Fill #0 | Status: AC

## 2021-01-24 ENCOUNTER — Other Ambulatory Visit (HOSPITAL_COMMUNITY): Payer: Self-pay

## 2021-02-19 ENCOUNTER — Other Ambulatory Visit (HOSPITAL_COMMUNITY): Payer: Self-pay

## 2021-02-19 ENCOUNTER — Other Ambulatory Visit: Payer: Self-pay | Admitting: Obstetrics and Gynecology

## 2021-02-19 MED ORDER — SPIRONOLACTONE 50 MG PO TABS
ORAL_TABLET | ORAL | 2 refills | Status: DC
  Filled 2021-02-19: qty 60, fill #0

## 2021-02-19 NOTE — Telephone Encounter (Signed)
Please advise. Thanks [employer]

## 2021-02-20 ENCOUNTER — Other Ambulatory Visit: Payer: Self-pay | Admitting: Obstetrics and Gynecology

## 2021-02-20 ENCOUNTER — Other Ambulatory Visit (HOSPITAL_COMMUNITY): Payer: Self-pay

## 2021-02-20 MED ORDER — SPIRONOLACTONE 50 MG PO TABS
50.0000 mg | ORAL_TABLET | Freq: Two times a day (BID) | ORAL | 0 refills | Status: DC
Start: 2021-02-20 — End: 2021-05-07
  Filled 2021-02-20: qty 120, 60d supply, fill #0

## 2021-03-24 DIAGNOSIS — M7712 Lateral epicondylitis, left elbow: Secondary | ICD-10-CM | POA: Diagnosis not present

## 2021-03-24 DIAGNOSIS — Z79899 Other long term (current) drug therapy: Secondary | ICD-10-CM | POA: Diagnosis not present

## 2021-03-24 DIAGNOSIS — M0600 Rheumatoid arthritis without rheumatoid factor, unspecified site: Secondary | ICD-10-CM | POA: Diagnosis not present

## 2021-03-26 DIAGNOSIS — H524 Presbyopia: Secondary | ICD-10-CM | POA: Diagnosis not present

## 2021-03-26 DIAGNOSIS — Z135 Encounter for screening for eye and ear disorders: Secondary | ICD-10-CM | POA: Diagnosis not present

## 2021-03-26 DIAGNOSIS — H52223 Regular astigmatism, bilateral: Secondary | ICD-10-CM | POA: Diagnosis not present

## 2021-04-30 MED ORDER — FREESTYLE LITE W/DEVICE KIT
PACK | 0 refills | Status: AC
  Filled 2021-04-30: qty 1, 1d supply, fill #0

## 2021-04-30 MED ORDER — FREESTYLE LANCETS MISC
12 refills | Status: AC
  Filled 2021-04-30: qty 100, 30d supply, fill #0

## 2021-04-30 MED ORDER — GLUCOSE BLOOD VI STRP
ORAL_STRIP | 12 refills | Status: AC
  Filled 2021-04-30: qty 100, 30d supply, fill #0

## 2021-05-01 ENCOUNTER — Other Ambulatory Visit (HOSPITAL_COMMUNITY): Payer: Self-pay

## 2021-05-07 ENCOUNTER — Other Ambulatory Visit: Payer: Self-pay | Admitting: Obstetrics and Gynecology

## 2021-05-08 ENCOUNTER — Other Ambulatory Visit (HOSPITAL_COMMUNITY): Payer: Self-pay

## 2021-05-08 MED ORDER — SPIRONOLACTONE 50 MG PO TABS
50.0000 mg | ORAL_TABLET | Freq: Two times a day (BID) | ORAL | 1 refills | Status: DC
  Filled 2021-05-08: qty 120, 60d supply, fill #0

## 2021-05-23 ENCOUNTER — Other Ambulatory Visit (HOSPITAL_COMMUNITY): Payer: Self-pay

## 2021-06-12 ENCOUNTER — Encounter: Payer: 59 | Admitting: Obstetrics and Gynecology

## 2021-06-27 ENCOUNTER — Other Ambulatory Visit (HOSPITAL_COMMUNITY): Payer: Self-pay

## 2021-06-27 MED FILL — Paroxetine HCl Tab 10 MG: ORAL | 90 days supply | Qty: 90 | Fill #1 | Status: AC

## 2021-07-28 ENCOUNTER — Other Ambulatory Visit (HOSPITAL_COMMUNITY)
Admission: RE | Admit: 2021-07-28 | Discharge: 2021-07-28 | Disposition: A | Discharge status: Home / Self Care | Payer: 59 | Source: Ambulatory Visit | Attending: Obstetrics and Gynecology | Admitting: Obstetrics and Gynecology

## 2021-07-28 ENCOUNTER — Other Ambulatory Visit: Payer: Self-pay

## 2021-07-28 ENCOUNTER — Ambulatory Visit (INDEPENDENT_AMBULATORY_CARE_PROVIDER_SITE_OTHER): Payer: 59 | Admitting: Obstetrics and Gynecology

## 2021-07-28 ENCOUNTER — Other Ambulatory Visit (HOSPITAL_COMMUNITY): Payer: Self-pay

## 2021-07-28 ENCOUNTER — Encounter: Payer: Self-pay | Admitting: Obstetrics and Gynecology

## 2021-07-28 VITALS — BP 113/80 | HR 87 | Ht 61.0 in | Wt 178.8 lb

## 2021-07-28 DIAGNOSIS — Z01419 Encounter for gynecological examination (general) (routine) without abnormal findings: Secondary | ICD-10-CM

## 2021-07-28 DIAGNOSIS — E282 Polycystic ovarian syndrome: Secondary | ICD-10-CM | POA: Diagnosis not present

## 2021-07-28 DIAGNOSIS — I152 Hypertension secondary to endocrine disorders: Secondary | ICD-10-CM

## 2021-07-28 DIAGNOSIS — E1169 Type 2 diabetes mellitus with other specified complication: Secondary | ICD-10-CM | POA: Diagnosis not present

## 2021-07-28 DIAGNOSIS — E1159 Type 2 diabetes mellitus with other circulatory complications: Secondary | ICD-10-CM

## 2021-07-28 DIAGNOSIS — E669 Obesity, unspecified: Secondary | ICD-10-CM

## 2021-07-28 DIAGNOSIS — Z1211 Encounter for screening for malignant neoplasm of colon: Secondary | ICD-10-CM

## 2021-07-28 DIAGNOSIS — Z124 Encounter for screening for malignant neoplasm of cervix: Secondary | ICD-10-CM | POA: Diagnosis not present

## 2021-07-28 DIAGNOSIS — Z8371 Family history of colonic polyps: Secondary | ICD-10-CM

## 2021-07-28 MED ORDER — SPIRONOLACTONE 100 MG PO TABS
100.0000 mg | ORAL_TABLET | Freq: Two times a day (BID) | ORAL | 3 refills | Status: DC
  Filled 2021-07-28: qty 180, 90d supply, fill #0
  Filled 2022-03-23: qty 180, 90d supply, fill #1

## 2021-07-28 NOTE — Progress Notes (Signed)
GYNECOLOGY ANNUAL PHYSICAL EXAM PROGRESS NOTE  Subjective:    Mariah Brandt is a 50 y.o. G1P1001 separated female who presents for an annual exam.  The patient is not currently sexually active. The patient wears seatbelts: yes. The patient participates in regular exercise: no. Has the patient ever been transfused or tattooed?: no. The patient reports that there is not domestic violence in her life.   The patient has the following concerns today:  1. Notes that she is now officially divorced. Is doing well, notes that there is more peace in her life.  2. Also reports issues with increasing facial hair growth.  Does not think that the current dose of Spironolactone is helping anymore. Has a h/o PCOS.  3. Reports having to decrease Glipizide from 5 mg to 2.5 mg due to hypoglycemic episodes. Feels like this is working better for her. Fastings are ~ low 100s. Postprandials are between 120s-150s usually.   Gynecologic History  Menarche age: 43 No LMP recorded. Patient has had an ablation.  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  Last Pap: 06/2018. Results were: normal.  Denies h/o abnormal pap smears. Last mammogram: 11/11/2020. Results were: normal Last colonoscopy: has never had one. Notes Cologuard was ordered last year but did not receive the kit.  OB History  Gravida Para Term Preterm AB Living  1 1 1  0 0 1  SAB IAB Ectopic Multiple Live Births  0 0 0 0 1    # Outcome Date GA Lbr Len/2nd Weight Sex Delivery Anes PTL Lv  1 Term 1995 [redacted]w[redacted]d 6 lb 15 oz (3.147 kg) M Vag-Spont   LIV    Past Medical History:  Diagnosis Date   Anxiety    Arthritis    [place]thritis, rheumatoid ([place]    Constipation, chronic 1996   after chloecystetomy   Diabetes mellitus without complication ([place]    Dysmenorrhea    Essential hypertension 06/09/2015   Inflammatory arthritis    Menorrhagia    Obesity (BMI 30-39.9)     Past  Surgical History:  [place]   [place]   Procedure: DILATATION & CURETTAGE/HYSTEROSCOPY WITH NOVASURE ABLATION;  Surgeon: ARubie Maid MD;  Location: [facility] ORS;  Service: Gynecology;  Laterality: N/A;    Family History  Problem Relation Age of Onset   Hyperlipidemia Mother    Fibroids Mother    Thyroid disease Mother    Heart disease Father    Diabetes Paternal Aunt    Diabetes Maternal Grandmother    Breast cancer Neg Hx     Social History   Socioeconomic History   Marital status: Legally Separated    Spouse name: Not on file   Number of children: Not on file   Years of education: Not on file   Highest education level: Not on file  Occupational History   Not on file  Tobacco Use   Smoking status: Never   Smokeless tobacco: Never  Vaping Use   Vaping Use: Never used  Substance and Sexual Activity   Alcohol use: No   Drug use: No   Sexual activity: Yes    Birth control/protection: None  Other Topics Concern   Not on file  Social History Narrative   Not on file   Social Determinants of Health   Financial Resource Strain: Not on file  Food Insecurity: Not on  file  Transportation Needs: Not on file  Physical Activity: Not on file  Stress: Not on file  Social Connections: Not on file  Intimate Partner Violence: Not on file    Current Outpatient Medications on File Prior to Visit  Medication Sig Dispense Refill   Blood Glucose Monitoring Suppl (FREESTYLE LITE) w/Device KIT Use as directed up to 4 times daily 1 kit 0   folic acid (FOLVITE) 1 MG tablet Take 1 tablet (1 mg total) by mouth daily. 90 tablet 4   FOLIC ACID PO Take by mouth.     glipiZIDE (GLUCOTROL) 5 MG tablet Take 1 tablet (5 mg total) by mouth daily before breakfast. 90 tablet 3   glucose blood test strip Check your blood sugar levels right before mealtime. Then do it again 1 to 2 hours after  meal. Do this for 1 week, then only check before meals. 100 each 12   hydroxychloroquine (PLAQUENIL) 200 MG tablet Take by mouth daily.     hydroxychloroquine (PLAQUENIL) 200 MG tablet Take 1 tablet (200 mg total) by mouth 2 (two) times daily 180 tablet 1   Lancets (FREESTYLE) lancets Check your blood sugar levels right before mealtime. Then do it again 1 to 2 hours after meal. Do this for 1 week, then only check before meals. 100 each 12   methotrexate (RHEUMATREX) 2.5 MG tablet Take 7 tablets (17.5 mg total) by mouth once a week for 12 weeks. 84 tablet 1   methotrexate (RHEUMATREX) 7.5 MG tablet Take 7.5 mg by mouth once a week. Caution" Chemotherapy. Protect from light.     PARoxetine (PAXIL) 10 MG tablet TAKE 1 TABLET (10 MG TOTAL) BY MOUTH DAILY. 90 tablet 3   spironolactone (ALDACTONE) 50 MG tablet Take 1 tablet (50 mg total) by mouth 2 (two) times daily. 120 tablet 1   valACYclovir (VALTREX) 1000 MG tablet Take 1 tablet (1,000 mg total) by mouth 3 (three) times daily. (Patient taking differently: Take 1,000 mg by mouth 3 (three) times daily. As needed) 21 tablet 1   No current facility-administered medications on file prior to visit.    No Known Allergies   Review of Systems Constitutional: negative for chills, fatigue, fevers, unintententional weight loss or gain.  Eyes: negative for irritation, redness and visual disturbance Ears, nose, mouth, throat, and face: negative for hearing loss, nasal congestion, snoring and tinnitus Respiratory: negative for asthma, cough, sputum Cardiovascular: negative for chest pain, dyspnea, exertional chest pressure/discomfort, irregular heart beat, palpitations and syncope Gastrointestinal: negative for abdominal pain, change in bowel habits, nausea and vomiting Genitourinary:Negative for abnormal menstrual periods, genital lesions, sexual problems and vaginal discharge, dysuria and urinary incontinence.  Integument/breast: negative for breast lump,  breast tenderness and nipple discharge Hematologic/lymphatic: negative for bleeding and easy bruising Musculoskeletal:negative for back pain and muscle weakness Neurological: negative for dizziness, headaches, vertigo and weakness Endocrine: negative for diabetic symptoms including polydipsia, polyuria and skin dryness. Positive for increased facial hair growth.  Allergic/Immunologic: negative for hay fever and urticaria       Objective:  Blood pressure 113/80, pulse 87, height 5' 1"  (1.549 m), weight 178 lb 12.8 oz (81.1 kg). Body mass index is 33.78 kg/m.   General Appearance:    Alert, cooperative, no distress, appears stated age, mildly obese  Head:    Normocephalic, without obvious abnormality, atraumatic  Eyes:    PERRL, conjunctiva/corneas clear, EOM's intact, both eyes  Ears:    Normal external ear canals, both ears  Nose:  Nares normal, septum midline, mucosa normal, no drainage or sinus tenderness  Throat:   Lips, mucosa, and tongue normal; teeth and gums normal  Neck:   Supple, symmetrical, trachea midline, no adenopathy; thyroid: no enlargement/tenderness/nodules; no carotid bruit or JVD  Back:     Symmetric, no curvature, ROM normal, no CVA tenderness  Lungs:     Clear to auscultation bilaterally, respirations unlabored  Chest Wall:    No tenderness or deformity   Heart:    Regular rate and rhythm, S1 and S2 normal, no murmur, rub or gallop  Breast Exam:    No tenderness, masses, or nipple abnormality  Abdomen:     Soft, non-tender, bowel sounds active all four quadrants, no masses, no organomegaly.    Genitalia:    Pelvic:external genitalia normal, vagina without lesions, or tenderness. Rectovaginal septum  normal. Cervix normal in appearance, no cervical motion tenderness, no adnexal masses or tenderness.  Uterus normal size, shape, mobile, regular contours, nontender.  Rectal:    Normal external sphincter.  No hemorrhoids appreciated. Internal exam not done.    Extremities:   Extremities normal, atraumatic, no cyanosis or edema  Pulses:   2+ and symmetric all extremities  Skin:   Skin color, texture, turgor normal, no rashes or lesions. Increased facial hair noted along chin line.   Lymph nodes:   Cervical, supraclavicular, and axillary nodes normal  Neurologic:   CNII-XII intact, normal strength, sensation and reflexes throughout   .  Labs:  Lab Results  Component Value Date   TSH 2.170 06/30/2018    Lab Results  Component Value Date   CHOL 183 12/02/2020   HDL 52 12/02/2020   LDLCALC 114 (H) 12/02/2020   LDLDIRECT 127.0 09/22/2016   TRIG 96 12/02/2020   CHOLHDL 3.5 12/02/2020    Lab Results  Component Value Date   HGBA1C 6.9 (H) 12/02/2020    Lab Results  Component Value Date   TSH 2.170 06/30/2018     Other more recent labs in [place] (performed by Rheumatologist last month), reviewed.   Assessment:   Routine gynecologic exam Obesity Class I Diabetes  HTN PCOS with hirsutism Family history of colon polyps Dysmenorrhea  Plan:   - Labs: See orders.  - Breast self exam technique reviewed and patient encouraged to perform self-exam monthly. - Contraception: abstinent.  - Discussed healthy lifestyle modifications. - Mammogram up to date. - Pap smear performed today.  - Flu vaccine up to date.  - COVID vaccine: Has completed 2 dose vaccination series, is eligible for booster.  - Chronic hypertension managed by primary care provider. - BPs wnl.  - DM currently on Glipizide.  Dosage adjusted due to hypoglyemic episodes. Will repeat A1c today.  - Perimenopausal symptoms managed with Paxil.  - Discussed colon cancer screening, patient with mother with h/o benign polyp x 1 time. Desires to hold off on colonoscopy until age 55. Is willing to perform Cologuard, will reorder. - Will increase Spironolactone for hirsutism.  To reassess potassium levels today.   - Return to clinic in one year for annual  exam.   [name], MD [facility]

## 2021-07-29 LAB — LIPID PANEL
Chol/HDL Ratio: 3.6 ratio (ref 0.0–4.4)
Cholesterol, Total: 189 mg/dL (ref 100–199)
HDL: 52 mg/dL (ref >39)
LDL Chol Calc (NIH): 122 mg/dL — ABNORMAL HIGH (ref 0–99)
Triglycerides: 83 mg/dL (ref 0–149)
VLDL Cholesterol Cal: 15 mg/dL (ref 5–40)

## 2021-07-29 LAB — HEMOGLOBIN A1C
Est. average glucose Bld gHb Est-mCnc: 143 mg/dL
Hgb A1c MFr Bld: 6.6 % — ABNORMAL HIGH (ref 4.8–5.6)

## 2021-07-29 LAB — COMPREHENSIVE METABOLIC PANEL
ALT: 11 IU/L (ref 0–32)
AST: 16 IU/L (ref 0–40)
Albumin/Globulin Ratio: 1.5 (ref 1.2–2.2)
Albumin: 4.6 g/dL (ref 3.8–4.8)
Alkaline Phosphatase: 124 IU/L — ABNORMAL HIGH (ref 44–121)
BUN/Creatinine Ratio: 14 (ref 9–23)
BUN: 11 mg/dL (ref 6–24)
Bilirubin Total: 0.4 mg/dL (ref 0.0–1.2)
CO2: 22 mmol/L (ref 20–29)
Calcium: 9.5 mg/dL (ref 8.7–10.2)
Chloride: 103 mmol/L (ref 96–106)
Creatinine, Ser: 0.81 mg/dL (ref 0.57–1.00)
Globulin, Total: 3 g/dL (ref 1.5–4.5)
Glucose: 108 mg/dL — ABNORMAL HIGH (ref 70–99)
Potassium: 4.2 mmol/L (ref 3.5–5.2)
Sodium: 140 mmol/L (ref 134–144)
Total Protein: 7.6 g/dL (ref 6.0–8.5)
eGFR: 89 mL/min/{1.73_m2} (ref >59)

## 2021-07-29 LAB — TSH: TSH: 1.61 u[IU]/mL (ref 0.450–4.500)

## 2021-07-29 LAB — CBC
Hematocrit: 40.4 % (ref 34.0–46.6)
Hemoglobin: 13.8 g/dL (ref 11.1–15.9)
MCH: 30.5 pg (ref 26.6–33.0)
MCHC: 34.2 g/dL (ref 31.5–35.7)
MCV: 89 fL (ref 79–97)
Platelets: 289 10*3/uL (ref 150–450)
RBC: 4.52 x10E6/uL (ref 3.77–5.28)
RDW: 12.9 % (ref 11.7–15.4)
WBC: 7.6 10*3/uL (ref 3.4–10.8)

## 2021-07-30 LAB — CYTOLOGY - PAP
Adequacy: ABSENT
Comment: NEGATIVE
Diagnosis: NEGATIVE
High risk HPV: NEGATIVE

## 2021-08-12 DIAGNOSIS — Z1211 Encounter for screening for malignant neoplasm of colon: Secondary | ICD-10-CM | POA: Diagnosis not present

## 2021-08-14 ENCOUNTER — Encounter: Payer: 59 | Admitting: Obstetrics and Gynecology

## 2021-08-17 LAB — COLOGUARD: COLOGUARD: NEGATIVE

## 2021-08-28 ENCOUNTER — Other Ambulatory Visit (HOSPITAL_COMMUNITY): Payer: Self-pay

## 2021-08-28 DIAGNOSIS — Z79899 Other long term (current) drug therapy: Secondary | ICD-10-CM | POA: Diagnosis not present

## 2021-08-28 DIAGNOSIS — M0600 Rheumatoid arthritis without rheumatoid factor, unspecified site: Secondary | ICD-10-CM | POA: Diagnosis not present

## 2021-08-28 MED ORDER — METHOTREXATE 2.5 MG PO TABS
17.5000 mg | ORAL_TABLET | ORAL | 1 refills | Status: DC
  Filled 2021-08-28 – 2021-12-08 (×2): qty 84, 84d supply, fill #0

## 2021-09-01 ENCOUNTER — Other Ambulatory Visit (HOSPITAL_COMMUNITY): Payer: Self-pay

## 2021-09-01 ENCOUNTER — Encounter: Payer: Self-pay | Admitting: Obstetrics and Gynecology

## 2021-09-01 MED ORDER — FAMCICLOVIR 250 MG PO TABS
250.0000 mg | ORAL_TABLET | Freq: Two times a day (BID) | ORAL | 3 refills | Status: DC
Start: 2021-09-01 — End: 2022-12-02
  Filled 2021-09-01: qty 180, 90d supply, fill #0

## 2021-12-08 ENCOUNTER — Other Ambulatory Visit (HOSPITAL_COMMUNITY): Payer: Self-pay

## 2022-01-26 ENCOUNTER — Other Ambulatory Visit: Payer: Self-pay | Admitting: Internal Medicine

## 2022-01-26 ENCOUNTER — Other Ambulatory Visit: Payer: Self-pay | Admitting: Obstetrics and Gynecology

## 2022-01-26 DIAGNOSIS — Z1231 Encounter for screening mammogram for malignant neoplasm of breast: Secondary | ICD-10-CM

## 2022-01-30 ENCOUNTER — Other Ambulatory Visit (HOSPITAL_COMMUNITY): Payer: Self-pay

## 2022-01-30 DIAGNOSIS — M0609 Rheumatoid arthritis without rheumatoid factor, multiple sites: Secondary | ICD-10-CM | POA: Diagnosis not present

## 2022-01-30 DIAGNOSIS — Z79899 Other long term (current) drug therapy: Secondary | ICD-10-CM | POA: Diagnosis not present

## 2022-01-30 MED ORDER — FOLIC ACID 1 MG PO TABS
1.0000 mg | ORAL_TABLET | Freq: Every day | ORAL | 4 refills | Status: DC
  Filled 2022-01-30: qty 90, 90d supply, fill #0

## 2022-01-30 MED ORDER — METHOTREXATE 2.5 MG PO TABS
17.5000 mg | ORAL_TABLET | ORAL | 1 refills | Status: DC
  Filled 2022-01-30 – 2022-04-21 (×2): qty 84, 84d supply, fill #0

## 2022-02-27 ENCOUNTER — Ambulatory Visit
Admission: RE | Admit: 2022-02-27 | Discharge: 2022-02-27 | Disposition: A | Discharge status: Home / Self Care | Payer: 59 | Source: Ambulatory Visit | Attending: Obstetrics and Gynecology | Admitting: Obstetrics and Gynecology

## 2022-02-27 DIAGNOSIS — Z1231 Encounter for screening mammogram for malignant neoplasm of breast: Secondary | ICD-10-CM | POA: Insufficient documentation

## 2022-03-23 ENCOUNTER — Other Ambulatory Visit (HOSPITAL_COMMUNITY): Payer: Self-pay

## 2022-04-21 ENCOUNTER — Other Ambulatory Visit: Payer: Self-pay | Admitting: Obstetrics and Gynecology

## 2022-04-21 ENCOUNTER — Other Ambulatory Visit (HOSPITAL_COMMUNITY): Payer: Self-pay

## 2022-04-22 MED ORDER — GLIPIZIDE 5 MG PO TABS
5.0000 mg | ORAL_TABLET | Freq: Every day | ORAL | 3 refills | Status: DC
  Filled 2022-04-22: qty 90, 90d supply, fill #0

## 2022-04-23 ENCOUNTER — Other Ambulatory Visit (HOSPITAL_COMMUNITY): Payer: Self-pay

## 2022-06-29 ENCOUNTER — Other Ambulatory Visit (HOSPITAL_COMMUNITY): Payer: Self-pay

## 2022-06-29 DIAGNOSIS — M0609 Rheumatoid arthritis without rheumatoid factor, multiple sites: Secondary | ICD-10-CM | POA: Diagnosis not present

## 2022-06-29 DIAGNOSIS — G5603 Carpal tunnel syndrome, bilateral upper limbs: Secondary | ICD-10-CM | POA: Diagnosis not present

## 2022-06-29 DIAGNOSIS — Z79899 Other long term (current) drug therapy: Secondary | ICD-10-CM | POA: Diagnosis not present

## 2022-06-29 MED ORDER — METHOTREXATE SODIUM 2.5 MG PO TABS
20.0000 mg | ORAL_TABLET | ORAL | 1 refills | Status: DC
  Filled 2022-06-29: qty 96, 84d supply, fill #0

## 2022-06-29 MED ORDER — PREDNISONE 10 MG PO TABS
ORAL_TABLET | ORAL | 0 refills | Status: AC
  Filled 2022-06-29: qty 30, 12d supply, fill #0

## 2022-07-28 NOTE — Progress Notes (Unsigned)
GYNECOLOGY ANNUAL PHYSICAL EXAM PROGRESS NOTE  Subjective:    Mariah Brandt is a 51 y.o. G33P1001 female who presents for an annual exam. The patient has no complaints today. The patient {is/is not/has never been:13135} sexually active. The patient participates in regular exercise: {yes/no/not asked:9010}. Has the patient ever been transfused or tattooed?: {yes/no/not asked:9010}. The patient reports that there {is/is not:9024} domestic violence in her life.    Menstrual History: Menarche age: 28 No LMP recorded. Patient has had an ablation.     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 Results were: normal.  Denies h/o abnormal pap smears. Last mammogram: 02/27/2022. Results were: normal Last Cologuard: 08/12/2021: Negative: 3 years.      OB History  Gravida Para Term Preterm AB Living  _0 0 0 1  SAB IAB Ectopic Multiple Live Births  0 0 0 0 1    # Outcome Date GA Lbr Len/2nd Weight Sex Delivery Anes PTL Lv  1 Term 1995 [redacted]w[redacted]d 6 lb 15 oz (3.147 kg) M Vag-Spont   LIV    Past Medical History:  Diagnosis Date   Anxiety    Arthritis    [place]thritis, rheumatoid ([place]    Constipation, chronic 1996   after chloecystetomy   Diabetes mellitus without complication ([place]    Dysmenorrhea    Essential hypertension 06/09/2015   Inflammatory arthritis    Menorrhagia    Obesity (BMI 30-39.9)     Past Surgical History:  Procedure Laterality Date   COdeboltN/A 04/29/2015   Procedure: DILATATION & CURETTAGE/HYSTEROSCOPY WITH NOVASURE ABLATION;  Surgeon: ARubie Maid MD;  Location: [facility] ORS;  Service: Gynecology;  Laterality: N/A;    Family History  Problem Relation Age of Onset   Hyperlipidemia Mother    Fibroids Mother    Thyroid disease Mother    Heart disease Father    Diabetes Paternal  Aunt    Diabetes Maternal Grandmother    Breast cancer Neg Hx     Social History   Socioeconomic History   Marital status: Legally Separated    Spouse name: Not on file   Number of children: Not on file   Years of education: Not on file   Highest education level: Not on file  Occupational History   Not on file  Tobacco Use   Smoking status: Never   Smokeless tobacco: Never  Vaping Use   Vaping Use: Never used  Substance and Sexual Activity   Alcohol use: No   Drug use: No   Sexual activity: Yes    Birth control/protection: None  Other Topics Concern   Not on file  Social History Narrative   Not on file   Social Determinants of Health   Financial Resource Strain: Not on file  Food Insecurity: Not on file  Transportation Needs: Not on file  Physical Activity: Not on file  Stress: Not on file  Social Connections: Not on file  Intimate Partner Violence: Not on file    Current Outpatient Medications on File Prior to Visit  Medication Sig Dispense Refill   Blood Glucose Monitoring Suppl (FREESTYLE LITE) w/Device KIT Use as directed up to 4 times daily 1 kit 0   famciclovir (FAMVIR) 250 MG tablet Take 1 tablet (250 mg total) by mouth 2 (two) times daily. 180 tablet 3  folic acid (FOLVITE) 1 MG tablet Take 1 tablet (1 mg total) by mouth daily. 90 tablet 4   folic acid (FOLVITE) 1 MG tablet Take 1 tablet (1 mg total) by mouth daily. 90 tablet 4   FOLIC ACID PO Take by mouth.     glipiZIDE (GLUCOTROL) 5 MG tablet Take 1 tablet (5 mg total) by mouth daily before breakfast. 90 tablet 3   glucose blood test strip Check your blood sugar levels right before mealtime. Then do it again 1 to 2 hours after meal. Do this for 1 week, then only check before meals. 100 each 12   hydroxychloroquine (PLAQUENIL) 200 MG tablet Take by mouth daily.     hydroxychloroquine (PLAQUENIL) 200 MG tablet Take 1 tablet (200 mg total) by mouth 2 (two) times daily 180 tablet 1   Lancets (FREESTYLE)  lancets Check your blood sugar levels right before mealtime. Then do it again 1 to 2 hours after meal. Do this for 1 week, then only check before meals. 100 each 12   methotrexate (RHEUMATREX) 2.5 MG tablet Take 7 tablets (17.5 mg total) by mouth once a week. 84 tablet 1   methotrexate (RHEUMATREX) 2.5 MG tablet Take 7 tablets (17.5 mg total) by mouth once a week for 12 weeks 84 tablet 1   methotrexate (RHEUMATREX) 2.5 MG tablet Take 8 tablets (20 mg total) by mouth once a week. 96 tablet 1   methotrexate (RHEUMATREX) 7.5 MG tablet Take 7.5 mg by mouth once a week. Caution" Chemotherapy. Protect from light.     PARoxetine (PAXIL) 10 MG tablet TAKE 1 TABLET (10 MG TOTAL) BY MOUTH DAILY. 90 tablet 3   spironolactone (ALDACTONE) 100 MG tablet Take 1 tablet (100 mg total) by mouth 2 (two) times daily. 180 tablet 3   No current facility-administered medications on file prior to visit.    No Known Allergies   Review of Systems Constitutional: negative for chills, fatigue, fevers and sweats Eyes: negative for irritation, redness and visual disturbance Ears, nose, mouth, throat, and face: negative for hearing loss, nasal congestion, snoring and tinnitus Respiratory: negative for asthma, cough, sputum Cardiovascular: negative for chest pain, dyspnea, exertional chest pressure/discomfort, irregular heart beat, palpitations and syncope Gastrointestinal: negative for abdominal pain, change in bowel habits, nausea and vomiting Genitourinary: negative for abnormal menstrual periods, genital lesions, sexual problems and vaginal discharge, dysuria and urinary incontinence Integument/breast: negative for breast lump, breast tenderness and nipple discharge Hematologic/lymphatic: negative for bleeding and easy bruising Musculoskeletal:negative for back pain and muscle weakness Neurological: negative for dizziness, headaches, vertigo and weakness Endocrine: negative for diabetic symptoms including polydipsia,  polyuria and skin dryness Allergic/Immunologic: negative for hay fever and urticaria      Objective:  There were no vitals taken for this visit. There is no height or weight on file to calculate BMI.    General Appearance:    Alert, cooperative, no distress, appears stated age  Head:    Normocephalic, without obvious abnormality, atraumatic  Eyes:    PERRL, conjunctiva/corneas clear, EOM's intact, both eyes  Ears:    Normal external ear canals, both ears  Nose:   Nares normal, septum midline, mucosa normal, no drainage or sinus tenderness  Throat:   Lips, mucosa, and tongue normal; teeth and gums normal  Neck:   Supple, symmetrical, trachea midline, no adenopathy; thyroid: no enlargement/tenderness/nodules; no carotid bruit or JVD  Back:     Symmetric, no curvature, ROM normal, no CVA tenderness  Lungs:  Clear to auscultation bilaterally, respirations unlabored  Chest Wall:    No tenderness or deformity   Heart:    Regular rate and rhythm, S1 and S2 normal, no murmur, rub or gallop  Breast Exam:    No tenderness, masses, or nipple abnormality  Abdomen:     Soft, non-tender, bowel sounds active all four quadrants, no masses, no organomegaly.    Genitalia:    Pelvic:external genitalia normal, vagina without lesions, discharge, or tenderness, rectovaginal septum  normal. Cervix normal in appearance, no cervical motion tenderness, no adnexal masses or tenderness.  Uterus normal size, shape, mobile, regular contours, nontender.  Rectal:    Normal external sphincter.  No hemorrhoids appreciated. Internal exam not done.   Extremities:   Extremities normal, atraumatic, no cyanosis or edema  Pulses:   2+ and symmetric all extremities  Skin:   Skin color, texture, turgor normal, no rashes or lesions  Lymph nodes:   Cervical, supraclavicular, and axillary nodes normal  Neurologic:   CNII-XII intact, normal strength, sensation and reflexes throughout   .  Labs:  Lab Results  Component Value  Date   WBC 7.6 07/28/2021   HGB 13.8 07/28/2021   HCT 40.4 07/28/2021   MCV 89 07/28/2021   PLT 289 07/28/2021    Lab Results  Component Value Date   CREATININE 0.81 07/28/2021   BUN 11 07/28/2021   NA 140 07/28/2021   K 4.2 07/28/2021   CL 103 07/28/2021   CO2 22 07/28/2021    Lab Results  Component Value Date   ALT 11 07/28/2021   AST 16 07/28/2021   ALKPHOS 124 (H) 07/28/2021   BILITOT 0.4 07/28/2021    Lab Results  Component Value Date   TSH 1.610 07/28/2021     Assessment:   1. Encounter for well woman exam with routine gynecological exam   2. Encounter for screening mammogram for malignant neoplasm of breast   3. Screening for lipid disorders   4. Hirsutism   5. Obesity, diabetes, and hypertension syndrome ([place])   6. Screening for diabetes mellitus (DM)      Plan:  Blood tests: Ordered. Breast self exam technique reviewed and patient encouraged to perform self-exam monthly. Contraception: abstinence. Discussed healthy lifestyle modifications. Mammogram ordered for next visit Pap smear  UTD . COVID vaccination status: Follow up in 1 year for annual exam    [name], MD [place]

## 2022-07-28 NOTE — Patient Instructions (Incomplete)
Preventive Care 40-49 Years Old, Female Preventive care refers to lifestyle choices and visits with your health care provider that can promote health and wellness. Preventive care visits are also called wellness exams. What can I expect for my preventive care visit? Counseling Your health care provider may ask you questions about your: Medical history, including: Past medical problems. Family medical history. Pregnancy history. Current health, including: Menstrual cycle. Method of birth control. Emotional well-being. Home life and relationship well-being. Sexual activity and sexual health. Lifestyle, including: Alcohol, nicotine or tobacco, and drug use. Access to firearms. Diet, exercise, and sleep habits. Work and work environment. Sunscreen use. Safety issues such as seatbelt and bike helmet use. Physical exam Your health care provider will check your: Height and weight. These may be used to calculate your BMI (body mass index). BMI is a measurement that tells if you are at a healthy weight. Waist circumference. This measures the distance around your waistline. This measurement also tells if you are at a healthy weight and may help predict your risk of certain diseases, such as type 2 diabetes and high blood pressure. Heart rate and blood pressure. Body temperature. Skin for abnormal spots. What immunizations do I need?  Vaccines are usually given at various ages, according to a schedule. Your health care provider will recommend vaccines for you based on your age, medical history, and lifestyle or other factors, such as travel or where you work. What tests do I need? Screening Your health care provider may recommend screening tests for certain conditions. This may include: Lipid and cholesterol levels. Diabetes screening. This is done by checking your blood sugar (glucose) after you have not eaten for a while (fasting). Pelvic exam and Pap test. Hepatitis B test. Hepatitis C  test. HIV (human immunodeficiency virus) test. STI (sexually transmitted infection) testing, if you are at risk. Lung cancer screening. Colorectal cancer screening. Mammogram. Talk with your health care provider about when you should start having regular mammograms. This may depend on whether you have a family history of breast cancer. BRCA-related cancer screening. This may be done if you have a family history of breast, ovarian, tubal, or peritoneal cancers. Bone density scan. This is done to screen for osteoporosis. Talk with your health care provider about your test results, treatment options, and if necessary, the need for more tests. Follow these instructions at home: Eating and drinking  Eat a diet that includes fresh fruits and vegetables, whole grains, lean protein, and low-fat dairy products. Take vitamin and mineral supplements as recommended by your health care provider. Do not drink alcohol if: Your health care provider tells you not to drink. You are pregnant, may be pregnant, or are planning to become pregnant. If you drink alcohol: Limit how much you have to 0-1 drink a day. Know how much alcohol is in your drink. In the [place] one drink equals one 12 oz bottle of beer (355 mL), one 5 oz glass of wine (148 mL), or one 1 oz glass of hard liquor (44 mL). Lifestyle Brush your teeth every morning and night with fluoride toothpaste. Floss one time each day. Exercise for at least 30 minutes 5 or more days each week. Do not use any products that contain nicotine or tobacco. These products include cigarettes, chewing tobacco, and vaping devices, such as e-cigarettes. If you need help quitting, ask your health care provider. Do not use drugs. If you are sexually active, practice safe sex. Use a condom or other form of protection to   prevent STIs. If you do not wish to become pregnant, use a form of birth control. If you plan to become pregnant, see your health care provider for a  prepregnancy visit. Take aspirin only as told by your health care provider. Make sure that you understand how much to take and what form to take. Work with your health care provider to find out whether it is safe and beneficial for you to take aspirin daily. Find healthy ways to manage stress, such as: Meditation, yoga, or listening to music. Journaling. Talking to a trusted person. Spending time with friends and family. Minimize exposure to UV radiation to reduce your risk of skin cancer. Safety Always wear your seat belt while driving or riding in a vehicle. Do not drive: If you have been drinking alcohol. Do not ride with someone who has been drinking. When you are tired or distracted. While texting. If you have been using any mind-altering substances or drugs. Wear a helmet and other protective equipment during sports activities. If you have firearms in your house, make sure you follow all gun safety procedures. Seek help if you have been physically or sexually abused. What's next? Visit your health care provider once a year for an annual wellness visit. Ask your health care provider how often you should have your eyes and teeth checked. Stay up to date on all vaccines. This information is not intended to replace advice given to you by your health care provider. Make sure you discuss any questions you have with your health care provider. Document Revised: 01/22/2021 Document Reviewed: 01/22/2021 Elsevier Patient Education  [place].

## 2022-07-29 ENCOUNTER — Other Ambulatory Visit (HOSPITAL_COMMUNITY): Payer: Self-pay

## 2022-07-29 ENCOUNTER — Ambulatory Visit (INDEPENDENT_AMBULATORY_CARE_PROVIDER_SITE_OTHER): Payer: 59 | Admitting: Obstetrics and Gynecology

## 2022-07-29 ENCOUNTER — Encounter: Payer: Self-pay | Admitting: Obstetrics and Gynecology

## 2022-07-29 VITALS — BP 104/75 | HR 83 | Resp 16 | Ht 61.0 in | Wt 173.9 lb

## 2022-07-29 DIAGNOSIS — E282 Polycystic ovarian syndrome: Secondary | ICD-10-CM

## 2022-07-29 DIAGNOSIS — K5909 Other constipation: Secondary | ICD-10-CM

## 2022-07-29 DIAGNOSIS — E669 Obesity, unspecified: Secondary | ICD-10-CM

## 2022-07-29 DIAGNOSIS — Z01419 Encounter for gynecological examination (general) (routine) without abnormal findings: Secondary | ICD-10-CM

## 2022-07-29 DIAGNOSIS — Z01411 Encounter for gynecological examination (general) (routine) with abnormal findings: Secondary | ICD-10-CM

## 2022-07-29 DIAGNOSIS — Z23 Encounter for immunization: Secondary | ICD-10-CM | POA: Diagnosis not present

## 2022-07-29 DIAGNOSIS — I152 Hypertension secondary to endocrine disorders: Secondary | ICD-10-CM | POA: Diagnosis not present

## 2022-07-29 DIAGNOSIS — Z131 Encounter for screening for diabetes mellitus: Secondary | ICD-10-CM

## 2022-07-29 DIAGNOSIS — Z1231 Encounter for screening mammogram for malignant neoplasm of breast: Secondary | ICD-10-CM

## 2022-07-29 DIAGNOSIS — Z13220 Encounter for screening for lipoid disorders: Secondary | ICD-10-CM | POA: Diagnosis not present

## 2022-07-29 DIAGNOSIS — E1159 Type 2 diabetes mellitus with other circulatory complications: Secondary | ICD-10-CM | POA: Diagnosis not present

## 2022-07-29 DIAGNOSIS — E1169 Type 2 diabetes mellitus with other specified complication: Secondary | ICD-10-CM | POA: Diagnosis not present

## 2022-07-29 DIAGNOSIS — L680 Hirsutism: Secondary | ICD-10-CM

## 2022-07-29 DIAGNOSIS — Z114 Encounter for screening for human immunodeficiency virus [HIV]: Secondary | ICD-10-CM

## 2022-07-29 MED ORDER — LINACLOTIDE 145 MCG PO CAPS
145.0000 ug | ORAL_CAPSULE | Freq: Every day | ORAL | 11 refills | Status: DC
  Filled 2022-07-29: qty 30, 30d supply, fill #0
  Filled 2022-12-18: qty 30, 30d supply, fill #1

## 2022-07-30 LAB — LIPID PANEL
Chol/HDL Ratio: 3.2 ratio (ref 0.0–4.4)
Cholesterol, Total: 181 mg/dL (ref 100–199)
HDL: 56 mg/dL (ref >39)
LDL Chol Calc (NIH): 102 mg/dL — ABNORMAL HIGH (ref 0–99)
Triglycerides: 133 mg/dL (ref 0–149)
VLDL Cholesterol Cal: 23 mg/dL (ref 5–40)

## 2022-07-30 LAB — HEMOGLOBIN A1C
Est. average glucose Bld gHb Est-mCnc: 160 mg/dL
Hgb A1c MFr Bld: 7.2 % — ABNORMAL HIGH (ref 4.8–5.6)

## 2022-07-30 LAB — TSH: TSH: 1.68 u[IU]/mL (ref 0.450–4.500)

## 2022-10-01 DIAGNOSIS — Z79899 Other long term (current) drug therapy: Secondary | ICD-10-CM | POA: Diagnosis not present

## 2022-10-01 DIAGNOSIS — M0609 Rheumatoid arthritis without rheumatoid factor, multiple sites: Secondary | ICD-10-CM | POA: Diagnosis not present

## 2022-10-20 ENCOUNTER — Other Ambulatory Visit: Payer: Self-pay | Admitting: Obstetrics and Gynecology

## 2022-10-27 ENCOUNTER — Other Ambulatory Visit (HOSPITAL_COMMUNITY): Payer: Self-pay

## 2022-11-17 ENCOUNTER — Encounter: Payer: Self-pay | Admitting: Obstetrics and Gynecology

## 2022-11-17 DIAGNOSIS — N951 Menopausal and female climacteric states: Secondary | ICD-10-CM

## 2022-11-17 NOTE — Telephone Encounter (Signed)
What are her reasons for desiring renewed, hot flushes or mood changes. She hasn't been on the medication for ~ 2 years. If mood changes, needs televisit for mood assessment and PHQ-9. If for hot flushes, ok to prescribe as I saw her for her annual several months ago.

## 2022-11-18 ENCOUNTER — Other Ambulatory Visit (HOSPITAL_COMMUNITY): Payer: Self-pay

## 2022-11-18 MED ORDER — PAROXETINE HCL 10 MG PO TABS
10.0000 mg | ORAL_TABLET | Freq: Every day | ORAL | 3 refills | Status: DC
  Filled 2022-11-18: qty 90, 90d supply, fill #0

## 2022-12-02 ENCOUNTER — Other Ambulatory Visit (HOSPITAL_COMMUNITY): Payer: Self-pay

## 2022-12-02 ENCOUNTER — Other Ambulatory Visit: Payer: Self-pay

## 2022-12-02 ENCOUNTER — Other Ambulatory Visit (HOSPITAL_COMMUNITY)
Admission: RE | Admit: 2022-12-02 | Discharge: 2022-12-02 | Disposition: A | Discharge status: Home / Self Care | Payer: Commercial Managed Care - PPO | Source: Ambulatory Visit | Attending: Obstetrics and Gynecology | Admitting: Obstetrics and Gynecology

## 2022-12-02 ENCOUNTER — Ambulatory Visit: Payer: Commercial Managed Care - PPO | Admitting: Obstetrics and Gynecology

## 2022-12-02 VITALS — BP 125/82 | HR 82 | Resp 16 | Ht 61.0 in | Wt 166.1 lb

## 2022-12-02 DIAGNOSIS — Z9889 Other specified postprocedural states: Secondary | ICD-10-CM

## 2022-12-02 DIAGNOSIS — R102 Pelvic and perineal pain unspecified side: Secondary | ICD-10-CM

## 2022-12-02 DIAGNOSIS — B009 Herpesviral infection, unspecified: Secondary | ICD-10-CM | POA: Diagnosis not present

## 2022-12-02 DIAGNOSIS — E282 Polycystic ovarian syndrome: Secondary | ICD-10-CM | POA: Diagnosis not present

## 2022-12-02 DIAGNOSIS — N939 Abnormal uterine and vaginal bleeding, unspecified: Secondary | ICD-10-CM | POA: Diagnosis not present

## 2022-12-02 DIAGNOSIS — B3731 Acute candidiasis of vulva and vagina: Secondary | ICD-10-CM | POA: Diagnosis not present

## 2022-12-02 MED ORDER — FAMCICLOVIR 250 MG PO TABS
250.0000 mg | ORAL_TABLET | Freq: Two times a day (BID) | ORAL | 3 refills | Status: DC
  Filled 2022-12-02: qty 180, 90d supply, fill #0

## 2022-12-02 NOTE — Progress Notes (Unsigned)
    GYNECOLOGY PROGRESS NOTE  Subjective:    Patient ID: Mariah Brandt, female    DOB: 25-Jan-1971, 52 y.o.   MRN: [id number]  HPI  Patient is a 52 y.o. G64P1001 female who presents for evaluation of vaginal bleeding and pain. She has spotting on and off. She normally does not have bleeding between her cycles but she did last week with some discharge. She also has had a herpes flare and she is unsure if the spotting is coming from that. She will need a refill on her Famvir. She also has pain with intercourse.   Period Cycle (Days): 25 Period Duration (Days): 4 Period Pattern: Regular Menstrual Flow: Light Menstrual Control: Panty liner Menstrual Control Change Freq (Hours): 8 Dysmenorrhea: (!) Severe Dysmenorrhea Symptoms: Cramping, Throbbing, Nausea   The following portions of the patient's history were reviewed and updated as appropriate: allergies, current medications, past family history, past medical history, past social history, past surgical history, and problem list.  Review of Systems Pertinent items are noted in HPI.   Objective:   There were no vitals taken for this visit. There is no height or weight on file to calculate BMI. General appearance: alert, cooperative, and no distress Abdomen: {abdominal exam:16834} Pelvic: {pelvic exam:16852::"cervix normal in appearance","external genitalia normal","no adnexal masses or tenderness","no cervical motion tenderness","rectovaginal septum normal","uterus normal size, shape, and consistency","vagina normal without discharge"} Extremities: {extremity exam:5109} Neurologic: {neuro exam:17854}   Assessment:   No diagnosis found.   Plan:   There are no diagnoses linked to this encounter.     Hildred Laser, MD [facility]

## 2022-12-03 ENCOUNTER — Encounter: Payer: Self-pay | Admitting: Obstetrics and Gynecology

## 2022-12-03 LAB — CERVICOVAGINAL ANCILLARY ONLY
Bacterial Vaginitis (gardnerella): NEGATIVE
Candida Glabrata: NEGATIVE
Candida Vaginitis: POSITIVE — AB
Chlamydia: NEGATIVE
Comment: NEGATIVE
Comment: NEGATIVE
Comment: NEGATIVE
Comment: NEGATIVE
Comment: NEGATIVE
Comment: NORMAL
Neisseria Gonorrhea: NEGATIVE
Trichomonas: NEGATIVE

## 2022-12-04 ENCOUNTER — Other Ambulatory Visit: Payer: Self-pay | Admitting: Obstetrics and Gynecology

## 2022-12-04 ENCOUNTER — Other Ambulatory Visit (HOSPITAL_COMMUNITY): Payer: Self-pay

## 2022-12-04 ENCOUNTER — Ambulatory Visit (INDEPENDENT_AMBULATORY_CARE_PROVIDER_SITE_OTHER): Payer: Commercial Managed Care - PPO

## 2022-12-04 DIAGNOSIS — R102 Pelvic and perineal pain: Secondary | ICD-10-CM

## 2022-12-04 DIAGNOSIS — N939 Abnormal uterine and vaginal bleeding, unspecified: Secondary | ICD-10-CM

## 2022-12-04 DIAGNOSIS — Z9889 Other specified postprocedural states: Secondary | ICD-10-CM | POA: Diagnosis not present

## 2022-12-04 DIAGNOSIS — E282 Polycystic ovarian syndrome: Secondary | ICD-10-CM

## 2022-12-04 MED ORDER — FLUCONAZOLE 150 MG PO TABS
150.0000 mg | ORAL_TABLET | Freq: Once | ORAL | 3 refills | Status: AC
  Filled 2022-12-04: qty 1, 1d supply, fill #0

## 2022-12-05 ENCOUNTER — Other Ambulatory Visit (HOSPITAL_COMMUNITY): Payer: Self-pay

## 2022-12-07 ENCOUNTER — Other Ambulatory Visit (HOSPITAL_COMMUNITY): Payer: Self-pay

## 2022-12-17 ENCOUNTER — Other Ambulatory Visit (HOSPITAL_COMMUNITY): Payer: Self-pay

## 2022-12-18 ENCOUNTER — Other Ambulatory Visit: Payer: Self-pay | Admitting: Internal Medicine

## 2022-12-21 ENCOUNTER — Other Ambulatory Visit (HOSPITAL_COMMUNITY): Payer: Self-pay

## 2022-12-21 MED ORDER — FOLIC ACID 1 MG PO TABS
1.0000 mg | ORAL_TABLET | Freq: Every day | ORAL | 3 refills | Status: AC
  Filled 2022-12-21: qty 90, 90d supply, fill #0
  Filled 2023-12-02: qty 90, 90d supply, fill #1

## 2022-12-21 MED ORDER — METHOTREXATE SODIUM 2.5 MG PO TABS
20.0000 mg | ORAL_TABLET | ORAL | 1 refills | Status: DC
  Filled 2022-12-21: qty 96, 84d supply, fill #0
  Filled 2023-05-20: qty 96, 84d supply, fill #1

## 2022-12-21 NOTE — Telephone Encounter (Signed)
Last visit with PCP 08/31/18 refill not appropriate and refilled by historical. Spoke with patient she will call pharmacy and have sent to Rheumatology I have declined refill can I remove you as PCP.

## 2023-01-26 ENCOUNTER — Encounter: Payer: Self-pay | Admitting: Obstetrics and Gynecology

## 2023-02-01 DIAGNOSIS — Z79899 Other long term (current) drug therapy: Secondary | ICD-10-CM | POA: Diagnosis not present

## 2023-02-01 DIAGNOSIS — M0609 Rheumatoid arthritis without rheumatoid factor, multiple sites: Secondary | ICD-10-CM | POA: Diagnosis not present

## 2023-02-17 DIAGNOSIS — H52223 Regular astigmatism, bilateral: Secondary | ICD-10-CM | POA: Diagnosis not present

## 2023-02-17 DIAGNOSIS — Z135 Encounter for screening for eye and ear disorders: Secondary | ICD-10-CM | POA: Diagnosis not present

## 2023-02-17 DIAGNOSIS — R7303 Prediabetes: Secondary | ICD-10-CM | POA: Diagnosis not present

## 2023-02-17 DIAGNOSIS — H524 Presbyopia: Secondary | ICD-10-CM | POA: Diagnosis not present

## 2023-03-01 ENCOUNTER — Ambulatory Visit
Admission: RE | Admit: 2023-03-01 | Discharge: 2023-03-01 | Disposition: A | Discharge status: Home / Self Care | Payer: Commercial Managed Care - PPO | Source: Ambulatory Visit | Attending: Obstetrics and Gynecology | Admitting: Obstetrics and Gynecology

## 2023-03-01 DIAGNOSIS — Z01419 Encounter for gynecological examination (general) (routine) without abnormal findings: Secondary | ICD-10-CM | POA: Diagnosis not present

## 2023-03-01 DIAGNOSIS — Z1231 Encounter for screening mammogram for malignant neoplasm of breast: Secondary | ICD-10-CM | POA: Diagnosis not present

## 2023-03-03 ENCOUNTER — Other Ambulatory Visit: Payer: Self-pay | Admitting: Obstetrics and Gynecology

## 2023-03-03 DIAGNOSIS — R928 Other abnormal and inconclusive findings on diagnostic imaging of breast: Secondary | ICD-10-CM

## 2023-03-03 DIAGNOSIS — N6489 Other specified disorders of breast: Secondary | ICD-10-CM

## 2023-03-05 ENCOUNTER — Other Ambulatory Visit (HOSPITAL_COMMUNITY): Payer: Self-pay

## 2023-03-08 ENCOUNTER — Ambulatory Visit
Admission: RE | Admit: 2023-03-08 | Discharge: 2023-03-08 | Disposition: A | Discharge status: Home / Self Care | Payer: Commercial Managed Care - PPO | Source: Ambulatory Visit | Attending: Obstetrics and Gynecology | Admitting: Obstetrics and Gynecology

## 2023-03-08 DIAGNOSIS — N6001 Solitary cyst of right breast: Secondary | ICD-10-CM | POA: Diagnosis not present

## 2023-03-08 DIAGNOSIS — N6489 Other specified disorders of breast: Secondary | ICD-10-CM | POA: Diagnosis not present

## 2023-03-08 DIAGNOSIS — N649 Disorder of breast, unspecified: Secondary | ICD-10-CM | POA: Diagnosis not present

## 2023-03-08 DIAGNOSIS — R92321 Mammographic fibroglandular density, right breast: Secondary | ICD-10-CM | POA: Diagnosis not present

## 2023-03-08 DIAGNOSIS — R928 Other abnormal and inconclusive findings on diagnostic imaging of breast: Secondary | ICD-10-CM

## 2023-05-20 ENCOUNTER — Other Ambulatory Visit (HOSPITAL_COMMUNITY): Payer: Self-pay

## 2023-06-03 ENCOUNTER — Other Ambulatory Visit (HOSPITAL_COMMUNITY): Payer: Self-pay

## 2023-06-03 DIAGNOSIS — M0609 Rheumatoid arthritis without rheumatoid factor, multiple sites: Secondary | ICD-10-CM | POA: Diagnosis not present

## 2023-06-03 DIAGNOSIS — Z79899 Other long term (current) drug therapy: Secondary | ICD-10-CM | POA: Diagnosis not present

## 2023-06-03 MED ORDER — METHOTREXATE SODIUM 2.5 MG PO TABS
20.0000 mg | ORAL_TABLET | ORAL | 1 refills | Status: DC
  Filled 2023-08-25: qty 96, 84d supply, fill #0
  Filled 2024-01-12: qty 96, 84d supply, fill #1

## 2023-08-02 ENCOUNTER — Other Ambulatory Visit (HOSPITAL_COMMUNITY): Payer: Self-pay

## 2023-08-02 ENCOUNTER — Ambulatory Visit: Payer: Commercial Managed Care - PPO | Admitting: Internal Medicine

## 2023-08-02 ENCOUNTER — Encounter: Payer: Self-pay | Admitting: Obstetrics and Gynecology

## 2023-08-02 ENCOUNTER — Encounter: Payer: Self-pay | Admitting: Internal Medicine

## 2023-08-02 VITALS — BP 122/84 | HR 82 | Temp 97.6°F | Ht 61.0 in | Wt 170.0 lb

## 2023-08-02 DIAGNOSIS — M130 Polyarthritis, unspecified: Secondary | ICD-10-CM

## 2023-08-02 DIAGNOSIS — E282 Polycystic ovarian syndrome: Secondary | ICD-10-CM | POA: Diagnosis not present

## 2023-08-02 DIAGNOSIS — K5909 Other constipation: Secondary | ICD-10-CM

## 2023-08-02 DIAGNOSIS — I10 Essential (primary) hypertension: Secondary | ICD-10-CM | POA: Diagnosis not present

## 2023-08-02 DIAGNOSIS — E119 Type 2 diabetes mellitus without complications: Secondary | ICD-10-CM

## 2023-08-02 LAB — HEMOGLOBIN A1C: Hgb A1c MFr Bld: 6.9 % — ABNORMAL HIGH (ref 4.6–6.5)

## 2023-08-02 MED ORDER — EMPAGLIFLOZIN 10 MG PO TABS
10.0000 mg | ORAL_TABLET | Freq: Every day | ORAL | 1 refills | Status: DC
  Filled 2023-08-02: qty 90, 90d supply, fill #0
  Filled 2023-11-01: qty 90, 90d supply, fill #1

## 2023-08-02 MED ORDER — SPIRONOLACTONE 50 MG PO TABS
50.0000 mg | ORAL_TABLET | Freq: Every day | ORAL | 1 refills | Status: DC
  Filled 2023-08-02: qty 90, 90d supply, fill #0
  Filled 2023-11-01: qty 90, 90d supply, fill #1

## 2023-08-02 MED ORDER — LINACLOTIDE 145 MCG PO CAPS
145.0000 ug | ORAL_CAPSULE | Freq: Every day | ORAL | 11 refills | Status: AC
  Filled 2023-08-02: qty 30, 30d supply, fill #0
  Filled 2024-01-12: qty 30, 30d supply, fill #1

## 2023-08-02 NOTE — Progress Notes (Signed)
[facility] [address] [place] [address] Dept: [phone] Dept Fax: [phone]  New Patient Office Visit  Subjective:   Mariah Brandt Oct 25, 1970 08/02/2023  Chief Complaint  Patient presents with   Establish Care   Medication Refill    HPI: Mariah Brandt presents today to establish care at [facility] [employer]. Introduced to Publishing rights manager role and practice setting.  All questions answered.  Concerns: See below   Discussed the use of AI scribe software for clinical note transcription with the patient, who gave verbal consent to proceed.  History of Present Illness   The patient, with a history of high blood pressure, type 2 diabetes, and polyarthritis, is establishing care. The patient's rheumatologist manages the polyarthritis with methotrexate and folic acid. The patient also takes spironolactone for polycystic ovary syndrome and blood pressure control, but wants to lower the dose due to lack of effectiveness on facial hair and difficulty remembering to take it twice daily.  The patient's diabetes is managed with glipizide, but the patient reports that it causes hypoglycemia, with blood sugars dropping to 50. The patient has tried to manage the diabetes with diet, but fasting blood sugars have been elevated, ranging from 124 to 137. The patient has tried metformin in the past, but it caused bleeding and abdominal pain. The patient also reports constipation, managed with Linzess, and requests a refill.       The following portions of the patient's history were reviewed and updated as appropriate: past medical history, past surgical history, family history, social history, allergies, medications, and problem list.   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   Herpes zoster  without complication 07/15/2018   Elevated liver enzymes 03/27/2017   Polyarthritis 03/24/2017   Fatigue 09/24/2016   Essential hypertension 06/09/2015   Menorrhagia with irregular cycle 03/13/2015   Constipation 06/30/2014   Encounter for health maintenance examination 06/23/2013   Adenomyosis 06/21/2013   GAD (generalized anxiety disorder) 12/07/2011   Obesity (BMI 30-39.9)    Obesity, diabetes, and hypertension syndrome (HCC)    Screening for cervical cancer 07/24/2011   Past Medical History:  Diagnosis Date   Anxiety    Arthritis    Arthritis, rheumatoid (HCC)    Constipation, chronic 1996   after chloecystetomy   Diabetes mellitus without complication (HCC)    Dysmenorrhea    Essential hypertension 06/09/2015   Inflammatory arthritis    Menorrhagia    Obesity (BMI 30-39.9)    Past Surgical History:  Procedure Laterality Date   CHOLECYSTECTOMY  1996   DILITATION & CURRETTAGE/HYSTROSCOPY WITH NOVASURE ABLATION N/A 04/29/2015   Procedure: DILATATION & CURETTAGE/HYSTEROSCOPY WITH NOVASURE ABLATION;  Surgeon: Hildred Laser, MD;  Location: [facility] ORS;  Service: Gynecology;  Laterality: N/A;   Family History  Problem Relation Age of Onset   Hyperlipidemia Mother    Fibroids Mother    Thyroid disease Mother    Heart disease Father    Diabetes Paternal Aunt    Diabetes Maternal Grandmother    Breast cancer Neg Hx     Current Outpatient Medications:    Blood Glucose Monitoring Suppl (FREESTYLE LITE) w/Device KIT, Use as directed up to 4 times daily, Disp: 1 kit, Rfl: 0   empagliflozin (JARDIANCE) 10 MG TABS tablet, Take 1 tablet (10 mg total) by mouth daily before breakfast., Disp: 90 tablet, Rfl:  1   famciclovir (FAMVIR) 250 MG tablet, Take 1 tablet (250 mg total) by mouth 2 (two) times daily., Disp: 180 tablet, Rfl: 3   folic acid (FOLVITE) 1 MG tablet, Take 1 tablet (1 mg total) by mouth daily., Disp: 90 tablet, Rfl: 4   folic acid (FOLVITE) 1 MG tablet, Take 1 tablet (1  mg total) by mouth daily., Disp: 90 tablet, Rfl: 3   glucose blood test strip, Check your blood sugar levels right before mealtime. Then do it again 1 to 2 hours after meal. Do this for 1 week, then only check before meals., Disp: 100 each, Rfl: 12   Lancets (FREESTYLE) lancets, Check your blood sugar levels right before mealtime. Then do it again 1 to 2 hours after meal. Do this for 1 week, then only check before meals., Disp: 100 each, Rfl: 12   methotrexate (RHEUMATREX) 2.5 MG tablet, Take 2.5 mg by mouth. Take 8 tablets (20 mg total) by mouth once a week., Disp: , Rfl:    methotrexate (RHEUMATREX) 2.5 MG tablet, Take 8 tablets (20 mg total) by mouth every 7 (seven) days., Disp: 96 tablet, Rfl: 1   PARoxetine (PAXIL) 10 MG tablet, Take 1 tablet (10 mg total) by mouth daily., Disp: 90 tablet, Rfl: 3   linaclotide (LINZESS) 145 MCG CAPS capsule, Take 1 capsule (145 mcg total) by mouth daily before breakfast. May take every other day if bowels become too loose., Disp: 30 capsule, Rfl: 11   spironolactone (ALDACTONE) 50 MG tablet, Take 1 tablet (50 mg total) by mouth daily., Disp: 90 tablet, Rfl: 1 Allergies  Allergen Reactions   Glipizide Other (See Comments)    hypoglycemia   Metformin And Related Other (See Comments)    Nausea/vomiting/diarrhea    ROS: A complete ROS was performed with pertinent positives/negatives noted in the HPI. The remainder of the ROS are negative.   Objective:   Today's Vitals   08/02/23 1358  BP: 122/84  Pulse: 82  Temp: 97.6 F (36.4 C)  TempSrc: Temporal  SpO2: 99%  Weight: 170 lb (77.1 kg)  Height: 5\' 1"  (1.549 m)    GENERAL: Well-appearing, in NAD. Well nourished.  SKIN: Pink, warm and dry. No rash, lesion, ulceration, or ecchymoses.  NECK: Trachea midline. Full ROM w/o pain or tenderness. No lymphadenopathy.  RESPIRATORY: Chest wall symmetrical. Respirations even and non-labored. Breath sounds clear to auscultation bilaterally.  CARDIAC: S1, S2  present, regular rate and rhythm. Peripheral pulses 2+ bilaterally.  EXTREMITIES: Without clubbing, cyanosis, or edema.  NEUROLOGIC: Steady, even gait.  PSYCH/MENTAL STATUS: Alert, oriented x 3. Cooperative, appropriate mood and affect.   Health Maintenance Due  Topic Date Due   HIV Screening  Never done   Zoster Vaccines- Shingrix (1 of 2) Never done   Diabetic kidney evaluation - Urine ACR  09/22/2017   FOOT EXAM  03/23/2018   OPHTHALMOLOGY EXAM  01/26/2019   COVID-19 Vaccine (3 - Pfizer risk series) 11/06/2019   Diabetic kidney evaluation - eGFR measurement  07/28/2022   HEMOGLOBIN A1C  01/28/2023    No results found for any visits on 08/02/23.  Assessment & Plan:  Assessment and Plan    Type 2 Diabetes Mellitus Fasting blood glucose elevated (137). Current regimen of Glipizide 5mg  causing hypoglycemia. Patient has history of intolerance to Metformin. -Discontinue Glipizide. -Start Jardiance once daily. -Check HbA1c today. -Recheck HbA1c in 3 months.  Hypertension/Polycystic Ovary Syndrome Managed with Spironolactone 100mg  twice daily. Patient reports desire to decrease dosage. -Reduce  Spironolactone to 50mg  once daily.  Polyarthritis Managed by rheumatologist with Methotrexate and Folic Acid. No changes discussed.  Constipation Managed with Linzess. No changes discussed. -Refill Linzess prescription.  Follow-up in 4 months to reassess blood pressure, blood glucose control, and overall health status.       Orders Placed This Encounter  Procedures   Hemoglobin A1c   Meds ordered this encounter  Medications   empagliflozin (JARDIANCE) 10 MG TABS tablet    Sig: Take 1 tablet (10 mg total) by mouth daily before breakfast.    Dispense:  90 tablet    Refill:  1    Supervising Provider:   Garnette Gunner [[id number]]   linaclotide Karlene Einstein) 145 MCG CAPS capsule    Sig: Take 1 capsule (145 mcg total) by mouth daily before breakfast. May take every other day if bowels  become too loose.    Dispense:  30 capsule    Refill:  11    Supervising Provider:   Garnette Gunner [[id number]]   spironolactone (ALDACTONE) 50 MG tablet    Sig: Take 1 tablet (50 mg total) by mouth daily.    Dispense:  90 tablet    Refill:  1    Supervising Provider:   Garnette Gunner [[id number]]    Return in about 4 months (around 12/01/2023) for Chronic Condition follow up and fasting lab work.   Salvatore Decent, FNP

## 2023-08-25 ENCOUNTER — Other Ambulatory Visit (HOSPITAL_COMMUNITY): Payer: Self-pay

## 2023-10-07 ENCOUNTER — Encounter: Payer: Self-pay | Admitting: Internal Medicine

## 2023-10-07 DIAGNOSIS — M0609 Rheumatoid arthritis without rheumatoid factor, multiple sites: Secondary | ICD-10-CM | POA: Diagnosis not present

## 2023-10-07 DIAGNOSIS — Z79899 Other long term (current) drug therapy: Secondary | ICD-10-CM | POA: Diagnosis not present

## 2023-10-08 ENCOUNTER — Other Ambulatory Visit (HOSPITAL_COMMUNITY): Payer: Self-pay

## 2023-10-08 ENCOUNTER — Ambulatory Visit: Payer: Commercial Managed Care - PPO | Admitting: Internal Medicine

## 2023-10-08 ENCOUNTER — Encounter: Payer: Self-pay | Admitting: Internal Medicine

## 2023-10-08 VITALS — BP 128/82 | HR 75 | Temp 98.0°F | Ht 61.0 in | Wt 165.2 lb

## 2023-10-08 DIAGNOSIS — B3731 Acute candidiasis of vulva and vagina: Secondary | ICD-10-CM | POA: Diagnosis not present

## 2023-10-08 MED ORDER — FLUCONAZOLE 150 MG PO TABS
ORAL_TABLET | ORAL | 2 refills | Status: DC
  Filled 2023-10-08: qty 2, 3d supply, fill #0
  Filled 2023-12-02: qty 2, 3d supply, fill #1

## 2023-10-08 NOTE — Progress Notes (Signed)
 [facility] [address] [place] [address] Dept: [phone] Dept Fax: [phone]  Acute Care Office Visit  Subjective:   Mariah Brandt 28-May-1971 10/08/2023  Chief Complaint  Patient presents with   Vaginitis    Noticed after taking jardince reoccurring     HPI: Discussed the use of AI scribe software for clinical note transcription with the patient, who gave verbal consent to proceed.  History of Present Illness   The patient, with a history of diabetes, presents with recurrent yeast infections since starting Jardiance in December. The first episode occurred one month after starting the medication and was self-treated with over-the-counter Monistat. The infection resolved but recurred two weeks later and was again self-treated with Monistat. The patient now reports a third episode of yeast infection with symptoms of white, clumpy discharge, swelling, and severe itching. She denies any odor. Despite the recurrent yeast infections, the patient reports significant improvement in her diabetes symptoms since starting Jardiance, including decreased appetite, weight loss, and improved mood stability. She expresses a strong desire to continue the medication if possible.       The following portions of the patient's history were reviewed and updated as appropriate: past medical history, past surgical history, family history, social history, allergies, medications, and problem list.   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   Herpes zoster without complication 07/15/2018   Elevated liver enzymes 03/27/2017   Polyarthritis 03/24/2017   Fatigue 09/24/2016   Essential hypertension 06/09/2015   Menorrhagia with irregular cycle 03/13/2015   Constipation 06/30/2014   Encounter for health maintenance examination  06/23/2013   Adenomyosis 06/21/2013   GAD (generalized anxiety disorder) 12/07/2011   Obesity (BMI 30-39.9)    Obesity, diabetes, and hypertension syndrome (HCC)    Screening for cervical cancer 07/24/2011   Past Medical History:  Diagnosis Date   Anxiety    Arthritis    Arthritis, rheumatoid (HCC)    Constipation, chronic 1996   after chloecystetomy   Diabetes mellitus without complication (HCC)    Dysmenorrhea    Essential hypertension 06/09/2015   Inflammatory arthritis    Menorrhagia    Obesity (BMI 30-39.9)    Past Surgical History:  Procedure Laterality Date   CHOLECYSTECTOMY  1996   DILITATION & CURRETTAGE/HYSTROSCOPY WITH NOVASURE ABLATION N/A 04/29/2015   Procedure: DILATATION & CURETTAGE/HYSTEROSCOPY WITH NOVASURE ABLATION;  Surgeon: Hildred Laser, MD;  Location: [facility] ORS;  Service: Gynecology;  Laterality: N/A;   Family History  Problem Relation Age of Onset   Hyperlipidemia Mother    Fibroids Mother    Thyroid disease Mother    Heart disease Father    Diabetes Paternal Aunt    Diabetes Maternal Grandmother    Breast cancer Neg Hx     Current Outpatient Medications:    Blood Glucose Monitoring Suppl (FREESTYLE LITE) w/Device KIT, Use as directed up to 4 times daily, Disp: 1 kit, Rfl: 0   empagliflozin (JARDIANCE) 10 MG TABS tablet, Take 1 tablet (10 mg total) by mouth daily before breakfast., Disp: 90 tablet, Rfl: 1   famciclovir (FAMVIR) 250 MG tablet, Take 1 tablet (250 mg total) by mouth 2 (two) times daily., Disp: 180 tablet, Rfl: 3   fluconazole (DIFLUCAN) 150 MG tablet, Take 1 tablet by mouth once. Repeat dose in 3 days if symptoms persist., Disp: 2 tablet, Rfl: 2   folic  acid (FOLVITE) 1 MG tablet, Take 1 tablet (1 mg total) by mouth daily., Disp: 90 tablet, Rfl: 4   folic acid (FOLVITE) 1 MG tablet, Take 1 tablet (1 mg total) by mouth daily., Disp: 90 tablet, Rfl: 3   glucose blood test strip, Check your blood sugar levels right before mealtime. Then do  it again 1 to 2 hours after meal. Do this for 1 week, then only check before meals., Disp: 100 each, Rfl: 12   Lancets (FREESTYLE) lancets, Check your blood sugar levels right before mealtime. Then do it again 1 to 2 hours after meal. Do this for 1 week, then only check before meals., Disp: 100 each, Rfl: 12   linaclotide (LINZESS) 145 MCG CAPS capsule, Take 1 capsule (145 mcg total) by mouth daily before breakfast. May take every other day if bowels become too loose., Disp: 30 capsule, Rfl: 11   methotrexate (RHEUMATREX) 2.5 MG tablet, Take 2.5 mg by mouth. Take 8 tablets (20 mg total) by mouth once a week., Disp: , Rfl:    methotrexate (RHEUMATREX) 2.5 MG tablet, Take 8 tablets (20 mg total) by mouth every 7 (seven) days., Disp: 96 tablet, Rfl: 1   PARoxetine (PAXIL) 10 MG tablet, Take 1 tablet (10 mg total) by mouth daily., Disp: 90 tablet, Rfl: 3   spironolactone (ALDACTONE) 50 MG tablet, Take 1 tablet (50 mg total) by mouth daily., Disp: 90 tablet, Rfl: 1 Allergies  Allergen Reactions   Glipizide Other (See Comments)    hypoglycemia   Metformin And Related Other (See Comments)    Nausea/vomiting/diarrhea     ROS: A complete ROS was performed with pertinent positives/negatives noted in the HPI. The remainder of the ROS are negative.    Objective:   Today's Vitals   10/08/23 0956  BP: 128/82  Pulse: 75  Temp: 98 F (36.7 C)  TempSrc: Temporal  SpO2: 99%  Weight: 165 lb 3.2 oz (74.9 kg)  Height: 5\' 1"  (1.549 m)    GENERAL: Well-appearing, in NAD. Well nourished.  SKIN: Pink, warm and dry.  RESPIRATORY: Respirations even and non-labored.  PSYCH/MENTAL STATUS: Alert, oriented x 3. Cooperative, appropriate mood and affect.    No results found for any visits on 10/08/23.    Assessment & Plan:  Assessment and Plan    Recurrent Vulvovaginal Candidiasis Likely secondary to Jardiance use. Patient has had multiple episodes of yeast infections since starting Jardiance. Discussed  the benefits of Jardiance and the common side effect of yeast infections due to the mechanism of action of the medication. Patient prefers to continue Jardiance due to its positive impact on her lifestyle and glycemic control. -Prescribe Diflucan, one tablet now and repeat in three days if symptoms persist. -Advise patient to consider using a vaginal probiotic or boric acid suppositories to maintain vaginal pH and prevent recurrent infections. -Encourage patient to continue good hygiene practices, wear cotton underwear, and maintain adequate hydration. -If yeast infections persist, consider changing diabetes medication.        Meds ordered this encounter  Medications   fluconazole (DIFLUCAN) 150 MG tablet    Sig: Take 1 tablet by mouth once. Repeat dose in 3 days if symptoms persist.    Dispense:  2 tablet    Refill:  2    Supervising Provider:   Garnette Gunner [[id number]]   No orders of the defined types were placed in this encounter.  Lab Orders  No laboratory test(s) ordered today   No images are attached  to the encounter or orders placed in the encounter.  Return if symptoms worsen or fail to improve.   Salvatore Decent, FNP

## 2023-11-01 ENCOUNTER — Other Ambulatory Visit (HOSPITAL_COMMUNITY): Payer: Self-pay

## 2023-12-02 ENCOUNTER — Encounter: Payer: Self-pay | Admitting: Internal Medicine

## 2023-12-02 ENCOUNTER — Ambulatory Visit: Payer: Commercial Managed Care - PPO | Admitting: Internal Medicine

## 2023-12-02 ENCOUNTER — Other Ambulatory Visit (HOSPITAL_COMMUNITY): Payer: Self-pay

## 2023-12-02 VITALS — BP 120/82 | HR 76 | Temp 97.7°F | Ht 61.0 in | Wt 161.0 lb

## 2023-12-02 DIAGNOSIS — M130 Polyarthritis, unspecified: Secondary | ICD-10-CM | POA: Diagnosis not present

## 2023-12-02 DIAGNOSIS — I10 Essential (primary) hypertension: Secondary | ICD-10-CM

## 2023-12-02 DIAGNOSIS — E119 Type 2 diabetes mellitus without complications: Secondary | ICD-10-CM | POA: Diagnosis not present

## 2023-12-02 DIAGNOSIS — R109 Unspecified abdominal pain: Secondary | ICD-10-CM | POA: Diagnosis not present

## 2023-12-02 DIAGNOSIS — K5909 Other constipation: Secondary | ICD-10-CM | POA: Diagnosis not present

## 2023-12-02 DIAGNOSIS — Z7984 Long term (current) use of oral hypoglycemic drugs: Secondary | ICD-10-CM | POA: Diagnosis not present

## 2023-12-02 LAB — POC URINALSYSI DIPSTICK (AUTOMATED)
Bilirubin, UA: NEGATIVE
Blood, UA: POSITIVE
Glucose, UA: POSITIVE — AB
Ketones, UA: NEGATIVE
Leukocytes, UA: NEGATIVE
Nitrite, UA: NEGATIVE
Protein, UA: NEGATIVE
Spec Grav, UA: 1.02 (ref 1.010–1.025)
Urobilinogen, UA: 1 U/dL — AB
pH, UA: 6 (ref 5.0–8.0)

## 2023-12-02 LAB — BASIC METABOLIC PANEL WITH GFR
BUN: 9 mg/dL (ref 6–23)
CO2: 28 meq/L (ref 19–32)
Calcium: 9.5 mg/dL (ref 8.4–10.5)
Chloride: 104 meq/L (ref 96–112)
Creatinine, Ser: 0.84 mg/dL (ref 0.40–1.20)
GFR: 80.75 mL/min (ref >60.00)
Glucose, Bld: 122 mg/dL — ABNORMAL HIGH (ref 70–99)
Potassium: 3.5 meq/L (ref 3.5–5.1)
Sodium: 139 meq/L (ref 135–145)

## 2023-12-02 LAB — HEMOGLOBIN A1C: Hgb A1c MFr Bld: 6.4 % (ref 4.6–6.5)

## 2023-12-02 MED ORDER — CEPHALEXIN 500 MG PO CAPS
500.0000 mg | ORAL_CAPSULE | Freq: Two times a day (BID) | ORAL | 0 refills | Status: AC
  Filled 2023-12-02: qty 14, 7d supply, fill #0

## 2023-12-02 NOTE — Progress Notes (Unsigned)
 [facility] [address] [place] [address] Dept: [phone] Dept Fax: [phone]    Subjective:   Mariah Brandt 11/22/1970 12/02/2023  Chief Complaint  Patient presents with   Follow-up   Urinary Tract Infection    HPI: Mariah Brandt presents today for re-assessment and management of chronic medical conditions.  Discussed the use of AI scribe software for clinical note transcription with the patient, who gave verbal consent to proceed.  History of Present Illness   Mariah Brandt is a 53 year old female with hx of HTN, diabetes, PCOS, constipation, polyarthritis, who presents for a follow-up visit.  She has not been checking her blood sugar levels regularly. Since her last visit, she experienced one yeast infection two weeks ago, which resolved quickly with medication. She is currently taking Jardiance  without any other issues.  There is a weight loss of four pounds since her last visit, now weighing 161 pounds. She continues to take spironolactone  at a reduced dose of 50 mg and is doing well with it. She also uses Linzess  for constipation and continues her methotrexate  for polyarthritis, which is managed by her rheumatologist.  She describes a new onset of  right lower back pain that started on Monday, accompanied by chills and a general feeling of being unwell. The chills have resolved, but the back pain persists as a constant dull ache, different from her usual back pain related to a past injury. Her urine was unusually strong-smelling, raising concerns about a possible urinary tract infection (UTI). She has a history of microscopic hematuria but no visible blood or dysuria. No recent injury or heavy lifting. No nausea, vomiting.     The following portions of the patient's history were reviewed and updated as appropriate: past medical history, past surgical history, family history, social history,  allergies, medications, and problem list.   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   Herpes zoster without complication 07/15/2018   Elevated liver enzymes 03/27/2017   Polyarthritis 03/24/2017   Fatigue 09/24/2016   Essential hypertension 06/09/2015   Menorrhagia with irregular cycle 03/13/2015   Constipation 06/30/2014   Encounter for health maintenance examination 06/23/2013   Adenomyosis 06/21/2013   GAD (generalized anxiety disorder) 12/07/2011   Obesity (BMI 30-39.9)    Obesity, diabetes, and hypertension syndrome (HCC)    Screening for cervical cancer 07/24/2011   Past Medical History:  Diagnosis Date   Anxiety    Arthritis    Arthritis, rheumatoid (HCC)    Constipation, chronic 1996   after chloecystetomy   Diabetes mellitus without complication (HCC)    Dysmenorrhea    Essential hypertension 06/09/2015   Inflammatory arthritis    Menorrhagia    Obesity (BMI 30-39.9)    Past Surgical History:  Procedure Laterality Date   CHOLECYSTECTOMY  1996   DILITATION & CURRETTAGE/HYSTROSCOPY WITH NOVASURE ABLATION N/A 04/29/2015   Procedure: DILATATION & CURETTAGE/HYSTEROSCOPY WITH NOVASURE ABLATION;  Surgeon: Teresa Fender, MD;  Location: [facility];  Service: Gynecology;  Laterality: N/A;   Family History  Problem Relation Age of Onset   Hyperlipidemia Mother    Fibroids Mother    Thyroid  disease Mother    Heart disease Father    Diabetes Paternal Aunt    Diabetes Maternal Grandmother    Breast cancer Neg Hx     Current Outpatient  Medications:    Blood Glucose Monitoring Suppl (FREESTYLE LITE) w/Device KIT, Use as directed up to 4 times daily, Disp: 1 kit, Rfl: 0   cephALEXin  (KEFLEX ) 500 MG capsule, Take 1 capsule (500 mg total) by mouth 2 (two) times daily for 7 days., Disp: 14 capsule, Rfl: 0   empagliflozin   (JARDIANCE ) 10 MG TABS tablet, Take 1 tablet (10 mg total) by mouth daily before breakfast., Disp: 90 tablet, Rfl: 1   famciclovir  (FAMVIR ) 250 MG tablet, Take 1 tablet (250 mg total) by mouth 2 (two) times daily., Disp: 180 tablet, Rfl: 3   folic acid  (FOLVITE ) 1 MG tablet, Take 1 tablet (1 mg total) by mouth daily., Disp: 90 tablet, Rfl: 4   folic acid  (FOLVITE ) 1 MG tablet, Take 1 tablet (1 mg total) by mouth daily., Disp: 90 tablet, Rfl: 3   glucose blood test strip, Check your blood sugar levels right before mealtime. Then do it again 1 to 2 hours after meal. Do this for 1 week, then only check before meals., Disp: 100 each, Rfl: 12   Lancets (FREESTYLE) lancets, Check your blood sugar levels right before mealtime. Then do it again 1 to 2 hours after meal. Do this for 1 week, then only check before meals., Disp: 100 each, Rfl: 12   linaclotide  (LINZESS ) 145 MCG CAPS capsule, Take 1 capsule (145 mcg total) by mouth daily before breakfast. May take every other day if bowels become too loose., Disp: 30 capsule, Rfl: 11   methotrexate  (RHEUMATREX) 2.5 MG tablet, Take 2.5 mg by mouth. Take 8 tablets (20 mg total) by mouth once a week., Disp: , Rfl:    spironolactone  (ALDACTONE ) 50 MG tablet, Take 1 tablet (50 mg total) by mouth daily., Disp: 90 tablet, Rfl: 1   fluconazole  (DIFLUCAN ) 150 MG tablet, Take 1 tablet by mouth once. Repeat dose in 3 days if symptoms persist. (Patient not taking: Reported on 12/02/2023), Disp: 2 tablet, Rfl: 2   methotrexate  (RHEUMATREX) 2.5 MG tablet, Take 8 tablets (20 mg total) by mouth every 7 (seven) days., Disp: 96 tablet, Rfl: 1 Allergies  Allergen Reactions   Glipizide  Other (See Comments)    hypoglycemia   Metformin  And Related Other (See Comments)    Nausea/vomiting/diarrhea     ROS: A complete ROS was performed with pertinent positives/negatives noted in the HPI. The remainder of the ROS are negative.    Objective:   Today's Vitals   12/02/23 1435  BP:  120/82  Pulse: 76  Temp: 97.7 F (36.5 C)  TempSrc: Temporal  SpO2: 99%  Weight: 161 lb (73 kg)  Height: 5\' 1"  (1.549 m)    GENERAL: Well-appearing, in NAD. Well nourished.  SKIN: Pink, warm and dry. No rash, lesion, ulceration, or ecchymoses.  NECK: Trachea midline. Full ROM w/o pain or tenderness. No lymphadenopathy.  RESPIRATORY: Chest wall symmetrical. Respirations even and non-labored. Breath sounds clear to auscultation bilaterally.  CARDIAC: S1, S2 present, regular rate and rhythm. Peripheral pulses 2+ bilaterally.  MSK: Muscle tone and strength appropriate for age.  GI: Abdomen soft, non-tender. Normoactive bowel sounds. No rebound tenderness. No hepatomegaly or splenomegaly. No CVA tenderness.  EXTREMITIES: Without clubbing, cyanosis, or edema.  NEUROLOGIC: No motor or sensory deficits. Steady, even gait.  PSYCH/MENTAL STATUS: Alert, oriented x 3. Cooperative, appropriate mood and affect.   Health Maintenance Due  Topic Date Due   Diabetic kidney evaluation - Urine ACR  09/22/2017   FOOT EXAM  03/23/2018    Results for  orders placed or performed in visit on 12/02/23  Hemoglobin A1C  Result Value Ref Range   Hgb A1c MFr Bld 6.4 4.6 - 6.5 %  Basic Metabolic Panel (BMET)  Result Value Ref Range   Sodium 139 135 - 145 mEq/L   Potassium 3.5 3.5 - 5.1 mEq/L   Chloride 104 96 - 112 mEq/L   CO2 28 19 - 32 mEq/L   Glucose, Bld 122 (H) 70 - 99 mg/dL   BUN 9 6 - 23 mg/dL   Creatinine, Ser 7.82 0.40 - 1.20 mg/dL   GFR 95.62 >13.08 mL/min   Calcium 9.5 8.4 - 10.5 mg/dL  POCT Urinalysis Dipstick (Automated)  Result Value Ref Range   Color, UA yellow    Clarity, UA clear    Glucose, UA Positive (A) Negative   Bilirubin, UA neg    Ketones, UA neg    Spec Grav, UA 1.020 1.010 - 1.025   Blood, UA pos    pH, UA 6.0 5.0 - 8.0   Protein, UA Negative Negative   Urobilinogen, UA 1.0 (A) 0.2 or 1.0 E.U./dL   Nitrite, UA neg    Leukocytes, UA Negative Negative    The 10-year  ASCVD risk score (Arnett DK, et al., 2019) is: 5.6%     Assessment & Plan:  Assessment and Plan    Flank Pain  - Send urine for culture. - Prescribe Keflex  500 mg twice daily for 7 days.  Type 2 diabetes mellitus Diabetes management well-controlled. A1c to be checked today. Recent weight loss noted. - Check A1c , CMP  Hypertension Blood pressure well-controlled at 120/82 mmHg.  Polyarthritis Managed by rheumatologist. Continues on methotrexate  without issues.  Constipation Managed with Linzess  without issues.  General Health Maintenance Discussed vaccines. Declined shingles vaccine, considering pneumonia vaccine in fall. Eye exam up to date. - Consider shingles vaccine. - Consider pneumonia vaccine in the fall.     Orders Placed This Encounter  Procedures   Urine Culture   Hemoglobin A1C   Basic Metabolic Panel (BMET)   POCT Urinalysis Dipstick (Automated)   No images are attached to the encounter or orders placed in the encounter. Meds ordered this encounter  Medications   cephALEXin  (KEFLEX ) 500 MG capsule    Sig: Take 1 capsule (500 mg total) by mouth 2 (two) times daily for 7 days.    Dispense:  14 capsule    Refill:  0    Supervising Provider:   Catheryn Cluck [[id number]]    Return in about 3 months (around 03/02/2024) for Annual Physical Exam with fasting lab work.   Gavin Kast, FNP

## 2023-12-03 ENCOUNTER — Encounter: Payer: Self-pay | Admitting: Internal Medicine

## 2023-12-03 DIAGNOSIS — R3129 Other microscopic hematuria: Secondary | ICD-10-CM | POA: Insufficient documentation

## 2023-12-03 LAB — URINE CULTURE
MICRO NUMBER:: 16370953
Result:: NO GROWTH
SPECIMEN QUALITY:: ADEQUATE

## 2023-12-06 ENCOUNTER — Other Ambulatory Visit (HOSPITAL_COMMUNITY): Payer: Self-pay

## 2024-01-04 ENCOUNTER — Other Ambulatory Visit: Payer: Self-pay

## 2024-01-13 ENCOUNTER — Other Ambulatory Visit (HOSPITAL_COMMUNITY): Payer: Self-pay

## 2024-02-09 ENCOUNTER — Other Ambulatory Visit (HOSPITAL_COMMUNITY): Payer: Self-pay

## 2024-02-09 ENCOUNTER — Other Ambulatory Visit: Payer: Self-pay | Admitting: Internal Medicine

## 2024-02-09 ENCOUNTER — Other Ambulatory Visit: Payer: Self-pay

## 2024-02-09 DIAGNOSIS — I10 Essential (primary) hypertension: Secondary | ICD-10-CM

## 2024-02-09 DIAGNOSIS — E119 Type 2 diabetes mellitus without complications: Secondary | ICD-10-CM

## 2024-02-09 MED ORDER — EMPAGLIFLOZIN 10 MG PO TABS
10.0000 mg | ORAL_TABLET | Freq: Every day | ORAL | 1 refills | Status: DC
Start: 2024-02-09 — End: 2024-03-22
  Filled 2024-02-09: qty 90, 90d supply, fill #0

## 2024-02-09 MED ORDER — SPIRONOLACTONE 50 MG PO TABS
50.0000 mg | ORAL_TABLET | Freq: Every day | ORAL | 1 refills | Status: AC
  Filled 2024-02-09: qty 90, 90d supply, fill #0
  Filled 2024-05-31: qty 90, 90d supply, fill #1

## 2024-03-02 ENCOUNTER — Encounter: Payer: Self-pay | Admitting: Internal Medicine

## 2024-03-02 ENCOUNTER — Ambulatory Visit: Admitting: Internal Medicine

## 2024-03-02 ENCOUNTER — Other Ambulatory Visit (HOSPITAL_COMMUNITY): Payer: Self-pay

## 2024-03-02 VITALS — BP 110/80 | HR 81 | Temp 98.4°F | Ht 61.0 in | Wt 161.4 lb

## 2024-03-02 DIAGNOSIS — M5441 Lumbago with sciatica, right side: Secondary | ICD-10-CM | POA: Diagnosis not present

## 2024-03-02 DIAGNOSIS — Z1231 Encounter for screening mammogram for malignant neoplasm of breast: Secondary | ICD-10-CM

## 2024-03-02 DIAGNOSIS — G8929 Other chronic pain: Secondary | ICD-10-CM

## 2024-03-02 DIAGNOSIS — E119 Type 2 diabetes mellitus without complications: Secondary | ICD-10-CM | POA: Diagnosis not present

## 2024-03-02 DIAGNOSIS — Z0000 Encounter for general adult medical examination without abnormal findings: Secondary | ICD-10-CM

## 2024-03-02 DIAGNOSIS — Z7984 Long term (current) use of oral hypoglycemic drugs: Secondary | ICD-10-CM | POA: Diagnosis not present

## 2024-03-02 DIAGNOSIS — K439 Ventral hernia without obstruction or gangrene: Secondary | ICD-10-CM

## 2024-03-02 DIAGNOSIS — B3731 Acute candidiasis of vulva and vagina: Secondary | ICD-10-CM | POA: Diagnosis not present

## 2024-03-02 LAB — LIPID PANEL
Cholesterol: 187 mg/dL (ref 0–200)
HDL: 64.3 mg/dL (ref >39.00)
LDL Cholesterol: 103 mg/dL — ABNORMAL HIGH (ref 0–99)
NonHDL: 122.67
Total CHOL/HDL Ratio: 3
Triglycerides: 97 mg/dL (ref 0.0–149.0)
VLDL: 19.4 mg/dL (ref 0.0–40.0)

## 2024-03-02 LAB — COMPREHENSIVE METABOLIC PANEL WITH GFR
ALT: 19 U/L (ref 0–35)
AST: 19 U/L (ref 0–37)
Albumin: 4.7 g/dL (ref 3.5–5.2)
Alkaline Phosphatase: 107 U/L (ref 39–117)
BUN: 11 mg/dL (ref 6–23)
CO2: 29 meq/L (ref 19–32)
Calcium: 9.7 mg/dL (ref 8.4–10.5)
Chloride: 103 meq/L (ref 96–112)
Creatinine, Ser: 0.79 mg/dL (ref 0.40–1.20)
GFR: 86.77 mL/min (ref >60.00)
Glucose, Bld: 103 mg/dL — ABNORMAL HIGH (ref 70–99)
Potassium: 3.8 meq/L (ref 3.5–5.1)
Sodium: 139 meq/L (ref 135–145)
Total Bilirubin: 0.7 mg/dL (ref 0.2–1.2)
Total Protein: 8.2 g/dL (ref 6.0–8.3)

## 2024-03-02 LAB — CBC WITH DIFFERENTIAL/PLATELET
Basophils Absolute: 0 K/uL (ref 0.0–0.1)
Basophils Relative: 0.4 % (ref 0.0–3.0)
Eosinophils Absolute: 0 K/uL (ref 0.0–0.7)
Eosinophils Relative: 0.4 % (ref 0.0–5.0)
HCT: 43.6 % (ref 36.0–46.0)
Hemoglobin: 14.2 g/dL (ref 12.0–15.0)
Lymphocytes Relative: 32.3 % (ref 12.0–46.0)
Lymphs Abs: 1.8 K/uL (ref 0.7–4.0)
MCHC: 32.5 g/dL (ref 30.0–36.0)
MCV: 93.9 fl (ref 78.0–100.0)
Monocytes Absolute: 0.6 K/uL (ref 0.1–1.0)
Monocytes Relative: 10.2 % (ref 3.0–12.0)
Neutro Abs: 3.1 K/uL (ref 1.4–7.7)
Neutrophils Relative %: 56.7 % (ref 43.0–77.0)
Platelets: 242 K/uL (ref 150.0–400.0)
RBC: 4.64 Mil/uL (ref 3.87–5.11)
RDW: 14.6 % (ref 11.5–15.5)
WBC: 5.5 K/uL (ref 4.0–10.5)

## 2024-03-02 LAB — MICROALBUMIN / CREATININE URINE RATIO
Creatinine,U: 211.4 mg/dL
Microalb Creat Ratio: 16.2 mg/g (ref 0.0–30.0)
Microalb, Ur: 3.4 mg/dL — ABNORMAL HIGH (ref 0.0–1.9)

## 2024-03-02 LAB — TSH: TSH: 1.77 u[IU]/mL (ref 0.35–5.50)

## 2024-03-02 LAB — HEMOGLOBIN A1C: Hgb A1c MFr Bld: 7 % — ABNORMAL HIGH (ref 4.6–6.5)

## 2024-03-02 MED ORDER — FLUCONAZOLE 150 MG PO TABS
ORAL_TABLET | ORAL | 2 refills | Status: AC
  Filled 2024-03-02: qty 2, 4d supply, fill #0
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
## 2024-03-07 ENCOUNTER — Ambulatory Visit: Payer: Self-pay | Admitting: Internal Medicine

## 2024-03-08 ENCOUNTER — Encounter (HOSPITAL_BASED_OUTPATIENT_CLINIC_OR_DEPARTMENT_OTHER): Payer: Self-pay

## 2024-03-08 ENCOUNTER — Ambulatory Visit (HOSPITAL_BASED_OUTPATIENT_CLINIC_OR_DEPARTMENT_OTHER)
Admission: RE | Admit: 2024-03-08 | Discharge: 2024-03-08 | Disposition: A | Discharge status: Home / Self Care | Source: Ambulatory Visit | Attending: Internal Medicine | Admitting: Internal Medicine

## 2024-03-08 DIAGNOSIS — Z1231 Encounter for screening mammogram for malignant neoplasm of breast: Secondary | ICD-10-CM | POA: Diagnosis not present

## 2024-03-20 ENCOUNTER — Encounter: Payer: Self-pay | Admitting: Internal Medicine

## 2024-03-20 DIAGNOSIS — E119 Type 2 diabetes mellitus without complications: Secondary | ICD-10-CM

## 2024-03-22 ENCOUNTER — Other Ambulatory Visit (HOSPITAL_COMMUNITY): Payer: Self-pay

## 2024-03-22 MED ORDER — TIRZEPATIDE 5 MG/0.5ML ~~LOC~~ SOAJ
5.0000 mg | SUBCUTANEOUS | 0 refills | Status: DC
  Filled 2024-03-22 (×2): qty 6, 84d supply, fill #0
  Filled 2024-04-19: qty 2, 28d supply, fill #0
  Filled 2024-05-17: qty 2, 28d supply, fill #1
  Filled 2024-06-13: qty 2, 28d supply, fill #2

## 2024-03-22 MED ORDER — TIRZEPATIDE 2.5 MG/0.5ML ~~LOC~~ SOAJ
2.5000 mg | SUBCUTANEOUS | 0 refills | Status: DC
  Filled 2024-03-22: qty 2, 28d supply, fill #0

## 2024-03-23 ENCOUNTER — Other Ambulatory Visit (HOSPITAL_COMMUNITY): Payer: Self-pay

## 2024-03-29 ENCOUNTER — Encounter: Payer: Self-pay | Admitting: Internal Medicine

## 2024-03-29 DIAGNOSIS — H524 Presbyopia: Secondary | ICD-10-CM | POA: Diagnosis not present

## 2024-03-29 DIAGNOSIS — H52223 Regular astigmatism, bilateral: Secondary | ICD-10-CM | POA: Diagnosis not present

## 2024-03-29 LAB — HM DIABETES EYE EXAM

## 2024-04-07 ENCOUNTER — Other Ambulatory Visit (HOSPITAL_COMMUNITY): Payer: Self-pay

## 2024-04-07 DIAGNOSIS — M0609 Rheumatoid arthritis without rheumatoid factor, multiple sites: Secondary | ICD-10-CM | POA: Diagnosis not present

## 2024-04-07 DIAGNOSIS — Z79899 Other long term (current) drug therapy: Secondary | ICD-10-CM | POA: Diagnosis not present

## 2024-04-07 DIAGNOSIS — M62838 Other muscle spasm: Secondary | ICD-10-CM | POA: Diagnosis not present

## 2024-04-07 MED ORDER — METHOTREXATE SODIUM 2.5 MG PO TABS
15.0000 mg | ORAL_TABLET | ORAL | 1 refills | Status: AC
  Filled 2024-04-07 – 2024-05-31 (×2): qty 72, 84d supply, fill #0
  Filled 2024-09-06: qty 72, 84d supply, fill #1

## 2024-04-18 ENCOUNTER — Other Ambulatory Visit (HOSPITAL_COMMUNITY): Payer: Self-pay

## 2024-04-19 ENCOUNTER — Other Ambulatory Visit (HOSPITAL_COMMUNITY): Payer: Self-pay

## 2024-04-19 ENCOUNTER — Other Ambulatory Visit: Payer: Self-pay

## 2024-05-31 ENCOUNTER — Other Ambulatory Visit: Payer: Self-pay

## 2024-05-31 ENCOUNTER — Other Ambulatory Visit (HOSPITAL_COMMUNITY): Payer: Self-pay

## 2024-06-01 ENCOUNTER — Other Ambulatory Visit (HOSPITAL_COMMUNITY): Payer: Self-pay

## 2024-06-02 ENCOUNTER — Other Ambulatory Visit: Payer: Self-pay | Admitting: Obstetrics and Gynecology

## 2024-06-02 ENCOUNTER — Other Ambulatory Visit (HOSPITAL_COMMUNITY): Payer: Self-pay

## 2024-06-06 ENCOUNTER — Other Ambulatory Visit (HOSPITAL_COMMUNITY): Payer: Self-pay

## 2024-06-13 ENCOUNTER — Other Ambulatory Visit: Payer: Self-pay

## 2024-06-13 ENCOUNTER — Other Ambulatory Visit (HOSPITAL_COMMUNITY): Payer: Self-pay

## 2024-06-15 ENCOUNTER — Other Ambulatory Visit (HOSPITAL_COMMUNITY): Payer: Self-pay

## 2024-06-19 ENCOUNTER — Other Ambulatory Visit (HOSPITAL_COMMUNITY): Payer: Self-pay

## 2024-06-19 MED ORDER — FOLIC ACID 1 MG PO TABS
1.0000 mg | ORAL_TABLET | Freq: Every day | ORAL | 3 refills | Status: AC
  Filled 2024-06-19: qty 90, 90d supply, fill #0

## 2024-06-22 ENCOUNTER — Other Ambulatory Visit (HOSPITAL_COMMUNITY): Payer: Self-pay

## 2024-07-10 ENCOUNTER — Encounter: Payer: Self-pay | Admitting: Internal Medicine

## 2024-07-11 ENCOUNTER — Other Ambulatory Visit: Payer: Self-pay | Admitting: Internal Medicine

## 2024-07-11 DIAGNOSIS — E119 Type 2 diabetes mellitus without complications: Secondary | ICD-10-CM

## 2024-07-11 NOTE — Telephone Encounter (Signed)
 Pt need refill for medication famciclovir  (FAMVIR ) 250 MG tablet   Historical provider  LOV 03/02/24 FOV 09/04/24 LRF 12/02/22

## 2024-07-12 ENCOUNTER — Other Ambulatory Visit (HOSPITAL_COMMUNITY): Payer: Self-pay

## 2024-07-12 MED ORDER — MOUNJARO 5 MG/0.5ML ~~LOC~~ SOAJ
5.0000 mg | SUBCUTANEOUS | 0 refills | Status: AC
  Filled 2024-07-12: qty 6, 84d supply, fill #0

## 2024-07-12 NOTE — Telephone Encounter (Signed)
 Refill request received for Mounjaro  5mg  FOV:09/04/2024 LOV07/24/2025 Last refill:03/22/2024 Medication has been sent

## 2024-07-18 ENCOUNTER — Encounter: Payer: Self-pay | Admitting: Internal Medicine

## 2024-07-19 ENCOUNTER — Encounter: Payer: Self-pay | Admitting: Pharmacist

## 2024-07-19 ENCOUNTER — Other Ambulatory Visit (HOSPITAL_COMMUNITY): Payer: Self-pay

## 2024-07-19 MED ORDER — FAMCICLOVIR 250 MG PO TABS
250.0000 mg | ORAL_TABLET | Freq: Two times a day (BID) | ORAL | 3 refills | Status: AC
  Filled 2024-07-19: qty 180, 90d supply, fill #0

## 2024-07-19 NOTE — Telephone Encounter (Signed)
 Pt requesting refill for famciclovir  (FAMVIR ) 250 MG tablet  Historical provider  LOV 03/02/24 FOV 09/04/24 LRF 12/02/22

## 2024-07-20 ENCOUNTER — Other Ambulatory Visit (HOSPITAL_COMMUNITY): Payer: Self-pay

## 2024-07-24 ENCOUNTER — Other Ambulatory Visit: Payer: Self-pay | Admitting: Internal Medicine

## 2024-07-24 NOTE — Telephone Encounter (Signed)
 Pt need refill for medication famciclovir  (FAMVIR ) 250 MG tablet   Historical provider  LOV 03/02/24 FOV 09/04/24 LRF 12/02/22

## 2024-09-04 ENCOUNTER — Ambulatory Visit: Admitting: Internal Medicine

## 2024-09-19 ENCOUNTER — Ambulatory Visit: Admitting: Internal Medicine
